# Patient Record
Sex: Female | Born: 1960 | Race: White | Hispanic: No | Marital: Married | Smoking: Former smoker
Health system: Southern US, Community
[De-identification: ages and names within clinical notes are randomized; demographics above are authoritative.]

## PROBLEM LIST (undated history)

## (undated) DIAGNOSIS — N952 Postmenopausal atrophic vaginitis: Secondary | ICD-10-CM

## (undated) DIAGNOSIS — K635 Polyp of colon: Secondary | ICD-10-CM

## (undated) DIAGNOSIS — F32A Depression, unspecified: Secondary | ICD-10-CM

## (undated) DIAGNOSIS — G4700 Insomnia, unspecified: Secondary | ICD-10-CM

## (undated) DIAGNOSIS — F419 Anxiety disorder, unspecified: Secondary | ICD-10-CM

## (undated) DIAGNOSIS — H16139 Photokeratitis, unspecified eye: Secondary | ICD-10-CM

## (undated) DIAGNOSIS — E785 Hyperlipidemia, unspecified: Secondary | ICD-10-CM

## (undated) DIAGNOSIS — N809 Endometriosis, unspecified: Secondary | ICD-10-CM

## (undated) DIAGNOSIS — A6000 Herpesviral infection of urogenital system, unspecified: Secondary | ICD-10-CM

## (undated) DIAGNOSIS — M549 Dorsalgia, unspecified: Secondary | ICD-10-CM

## (undated) DIAGNOSIS — F329 Major depressive disorder, single episode, unspecified: Secondary | ICD-10-CM

## (undated) DIAGNOSIS — K5900 Constipation, unspecified: Secondary | ICD-10-CM

## (undated) DIAGNOSIS — N83209 Unspecified ovarian cyst, unspecified side: Secondary | ICD-10-CM

## (undated) HISTORY — DX: Polyp of colon: K63.5

## (undated) HISTORY — PX: ANKLE FRACTURE SURGERY: SHX122

## (undated) HISTORY — DX: Endometriosis, unspecified: N80.9

## (undated) HISTORY — PX: COLONOSCOPY: SHX174

## (undated) HISTORY — DX: Constipation, unspecified: K59.00

## (undated) HISTORY — DX: Depression, unspecified: F32.A

## (undated) HISTORY — DX: Anxiety disorder, unspecified: F41.9

## (undated) HISTORY — DX: Postmenopausal atrophic vaginitis: N95.2

## (undated) HISTORY — DX: Hyperlipidemia, unspecified: E78.5

## (undated) HISTORY — DX: Unspecified ovarian cyst, unspecified side: N83.209

## (undated) HISTORY — PX: OTHER SURGICAL HISTORY: SHX169

## (undated) HISTORY — PX: APPENDECTOMY: SHX54

## (undated) HISTORY — DX: Major depressive disorder, single episode, unspecified: F32.9

## (undated) HISTORY — DX: Photokeratitis, unspecified eye: H16.139

## (undated) HISTORY — DX: Dorsalgia, unspecified: M54.9

## (undated) HISTORY — PX: BREAST IMPLANT EXCHANGE: SHX6296

## (undated) HISTORY — PX: TUBAL LIGATION: SHX77

## (undated) HISTORY — DX: Herpesviral infection of urogenital system, unspecified: A60.00

## (undated) HISTORY — DX: Insomnia, unspecified: G47.00

## (undated) HISTORY — PX: EYE SURGERY: SHX253

---
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
## 1998-07-14 ENCOUNTER — Ambulatory Visit (HOSPITAL_COMMUNITY): Admission: RE | Admit: 1998-07-14 | Discharge: 1998-07-14 | Payer: Self-pay | Admitting: Obstetrics and Gynecology

## 1999-06-07 ENCOUNTER — Other Ambulatory Visit: Admission: RE | Admit: 1999-06-07 | Discharge: 1999-06-07 | Payer: Self-pay | Admitting: Obstetrics and Gynecology

## 2000-06-15 ENCOUNTER — Other Ambulatory Visit: Admission: RE | Admit: 2000-06-15 | Discharge: 2000-06-15 | Payer: Self-pay | Admitting: Obstetrics and Gynecology

## 2000-09-16 ENCOUNTER — Other Ambulatory Visit: Admission: RE | Admit: 2000-09-16 | Discharge: 2000-09-16 | Payer: Self-pay | Admitting: Gastroenterology

## 2001-07-12 ENCOUNTER — Other Ambulatory Visit: Admission: RE | Admit: 2001-07-12 | Discharge: 2001-07-12 | Payer: Self-pay | Admitting: Obstetrics and Gynecology

## 2002-06-22 ENCOUNTER — Other Ambulatory Visit: Admission: RE | Admit: 2002-06-22 | Discharge: 2002-06-22 | Payer: Self-pay | Admitting: Obstetrics and Gynecology

## 2002-11-15 ENCOUNTER — Encounter: Payer: Self-pay | Admitting: Emergency Medicine

## 2002-11-15 ENCOUNTER — Observation Stay (HOSPITAL_COMMUNITY): Admission: EM | Admit: 2002-11-15 | Discharge: 2002-11-16 | Payer: Self-pay | Admitting: Emergency Medicine

## 2003-07-27 ENCOUNTER — Other Ambulatory Visit: Admission: RE | Admit: 2003-07-27 | Discharge: 2003-07-27 | Payer: Self-pay | Admitting: Obstetrics and Gynecology

## 2004-08-14 ENCOUNTER — Other Ambulatory Visit: Admission: RE | Admit: 2004-08-14 | Discharge: 2004-08-14 | Payer: Self-pay | Admitting: Obstetrics and Gynecology

## 2004-10-08 ENCOUNTER — Ambulatory Visit: Payer: Self-pay | Admitting: Gastroenterology

## 2004-10-21 ENCOUNTER — Ambulatory Visit: Payer: Self-pay | Admitting: Gastroenterology

## 2004-12-23 ENCOUNTER — Other Ambulatory Visit: Admission: RE | Admit: 2004-12-23 | Discharge: 2004-12-23 | Payer: Self-pay | Admitting: Obstetrics and Gynecology

## 2005-03-24 ENCOUNTER — Ambulatory Visit: Payer: Self-pay | Admitting: Internal Medicine

## 2005-04-21 ENCOUNTER — Ambulatory Visit: Payer: Self-pay | Admitting: Family Medicine

## 2005-09-23 ENCOUNTER — Other Ambulatory Visit: Admission: RE | Admit: 2005-09-23 | Discharge: 2005-09-23 | Payer: Self-pay | Admitting: Obstetrics and Gynecology

## 2005-12-31 ENCOUNTER — Ambulatory Visit: Payer: Self-pay | Admitting: Internal Medicine

## 2006-08-04 ENCOUNTER — Ambulatory Visit: Payer: Self-pay | Admitting: Internal Medicine

## 2007-01-08 LAB — CONVERTED CEMR LAB: Pap Smear: NORMAL

## 2007-04-05 ENCOUNTER — Ambulatory Visit: Payer: Self-pay | Admitting: Internal Medicine

## 2007-04-05 LAB — CONVERTED CEMR LAB
ALT: 9 units/L (ref 0–40)
AST: 16 units/L (ref 0–37)
Cholesterol: 174 mg/dL (ref 0–200)
HDL: 61 mg/dL (ref >39.0)
LDL Cholesterol: 96 mg/dL (ref 0–99)
TSH: 1.56 microintl units/mL (ref 0.35–5.50)
Total CHOL/HDL Ratio: 2.9
Triglycerides: 87 mg/dL (ref 0–149)
VLDL: 17 mg/dL (ref 0–40)

## 2007-08-03 ENCOUNTER — Telehealth: Payer: Self-pay | Admitting: Internal Medicine

## 2007-11-25 ENCOUNTER — Telehealth: Payer: Self-pay | Admitting: Internal Medicine

## 2008-01-20 ENCOUNTER — Ambulatory Visit: Payer: Self-pay | Admitting: Internal Medicine

## 2008-01-20 DIAGNOSIS — E785 Hyperlipidemia, unspecified: Secondary | ICD-10-CM | POA: Insufficient documentation

## 2008-01-20 DIAGNOSIS — N809 Endometriosis, unspecified: Secondary | ICD-10-CM | POA: Insufficient documentation

## 2008-01-20 DIAGNOSIS — F32A Depression, unspecified: Secondary | ICD-10-CM | POA: Insufficient documentation

## 2008-01-20 DIAGNOSIS — F329 Major depressive disorder, single episode, unspecified: Secondary | ICD-10-CM

## 2008-01-20 DIAGNOSIS — F419 Anxiety disorder, unspecified: Secondary | ICD-10-CM

## 2008-01-27 LAB — CONVERTED CEMR LAB
ALT: 10 units/L (ref 0–35)
AST: 16 units/L (ref 0–37)
BUN: 13 mg/dL (ref 6–23)
Basophils Absolute: 0 10*3/uL (ref 0.0–0.1)
Basophils Relative: 0.1 % (ref 0.0–1.0)
CO2: 30 meq/L (ref 19–32)
Calcium: 9.6 mg/dL (ref 8.4–10.5)
Chloride: 104 meq/L (ref 96–112)
Cholesterol: 164 mg/dL (ref 0–200)
Creatinine, Ser: 0.9 mg/dL (ref 0.4–1.2)
Eosinophils Absolute: 0.1 10*3/uL (ref 0.0–0.6)
Eosinophils Relative: 1 % (ref 0.0–5.0)
GFR calc Af Amer: 87 mL/min
GFR calc non Af Amer: 72 mL/min
Glucose, Bld: 84 mg/dL (ref 70–99)
HCT: 37.5 % (ref 36.0–46.0)
HDL: 64.4 mg/dL (ref >39.0)
Hemoglobin: 12.5 g/dL (ref 12.0–15.0)
LDL Cholesterol: 88 mg/dL (ref 0–99)
Lymphocytes Relative: 31.5 % (ref 12.0–46.0)
MCHC: 33.2 g/dL (ref 30.0–36.0)
MCV: 93.7 fL (ref 78.0–100.0)
Monocytes Absolute: 0.3 10*3/uL (ref 0.2–0.7)
Monocytes Relative: 5.5 % (ref 3.0–11.0)
Neutro Abs: 3.3 10*3/uL (ref 1.4–7.7)
Neutrophils Relative %: 61.9 % (ref 43.0–77.0)
Platelets: 231 10*3/uL (ref 150–400)
Potassium: 3.8 meq/L (ref 3.5–5.1)
RBC: 4.01 M/uL (ref 3.87–5.11)
RDW: 12.3 % (ref 11.5–14.6)
Sodium: 141 meq/L (ref 135–145)
TSH: 1.15 microintl units/mL (ref 0.35–5.50)
Total CHOL/HDL Ratio: 2.5
Triglycerides: 57 mg/dL (ref 0–149)
VLDL: 11 mg/dL (ref 0–40)
WBC: 5.4 10*3/uL (ref 4.5–10.5)

## 2008-03-15 ENCOUNTER — Telehealth: Payer: Self-pay | Admitting: Internal Medicine

## 2008-08-07 ENCOUNTER — Telehealth (INDEPENDENT_AMBULATORY_CARE_PROVIDER_SITE_OTHER): Payer: Self-pay | Admitting: *Deleted

## 2008-08-25 ENCOUNTER — Ambulatory Visit: Payer: Self-pay | Admitting: Internal Medicine

## 2008-11-27 ENCOUNTER — Telehealth (INDEPENDENT_AMBULATORY_CARE_PROVIDER_SITE_OTHER): Payer: Self-pay | Admitting: *Deleted

## 2009-01-11 ENCOUNTER — Telehealth (INDEPENDENT_AMBULATORY_CARE_PROVIDER_SITE_OTHER): Payer: Self-pay | Admitting: *Deleted

## 2009-02-26 ENCOUNTER — Ambulatory Visit: Payer: Self-pay | Admitting: Internal Medicine

## 2009-02-28 LAB — CONVERTED CEMR LAB
BUN: 9 mg/dL (ref 6–23)
Basophils Absolute: 0 10*3/uL (ref 0.0–0.1)
Basophils Relative: 0.3 % (ref 0.0–3.0)
CO2: 31 meq/L (ref 19–32)
Calcium: 9.4 mg/dL (ref 8.4–10.5)
Creatinine, Ser: 0.7 mg/dL (ref 0.4–1.2)
Eosinophils Absolute: 0.1 10*3/uL (ref 0.0–0.7)
Glucose, Bld: 84 mg/dL (ref 70–99)
HCT: 35.7 % — ABNORMAL LOW (ref 36.0–46.0)
HDL: 62 mg/dL (ref >39.00)
Hemoglobin: 12.5 g/dL (ref 12.0–15.0)
LDL Cholesterol: 99 mg/dL (ref 0–99)
Lymphs Abs: 1.7 10*3/uL (ref 0.7–4.0)
MCHC: 35 g/dL (ref 30.0–36.0)
Neutro Abs: 3.1 10*3/uL (ref 1.4–7.7)
RBC: 3.87 M/uL (ref 3.87–5.11)
RDW: 12 % (ref 11.5–14.6)
Total CHOL/HDL Ratio: 3

## 2009-04-16 ENCOUNTER — Ambulatory Visit: Payer: Self-pay | Admitting: Gastroenterology

## 2009-08-08 ENCOUNTER — Telehealth (INDEPENDENT_AMBULATORY_CARE_PROVIDER_SITE_OTHER): Payer: Self-pay | Admitting: *Deleted

## 2009-08-15 ENCOUNTER — Ambulatory Visit: Payer: Self-pay | Admitting: Internal Medicine

## 2009-08-21 ENCOUNTER — Telehealth (INDEPENDENT_AMBULATORY_CARE_PROVIDER_SITE_OTHER): Payer: Self-pay | Admitting: *Deleted

## 2009-08-22 LAB — CONVERTED CEMR LAB
Basophils Absolute: 0 10*3/uL (ref 0.0–0.1)
Eosinophils Relative: 0.5 % (ref 0.0–5.0)
HCT: 37.6 % (ref 36.0–46.0)
Lymphocytes Relative: 20.6 % (ref 12.0–46.0)
Lymphs Abs: 1.9 10*3/uL (ref 0.7–4.0)
Monocytes Relative: 5.7 % (ref 3.0–12.0)
Platelets: 207 10*3/uL (ref 150.0–400.0)
RDW: 12.7 % (ref 11.5–14.6)
WBC: 9.4 10*3/uL (ref 4.5–10.5)

## 2009-09-17 ENCOUNTER — Telehealth: Payer: Self-pay | Admitting: Gastroenterology

## 2009-09-26 ENCOUNTER — Telehealth: Payer: Self-pay | Admitting: Gastroenterology

## 2009-09-26 ENCOUNTER — Encounter (INDEPENDENT_AMBULATORY_CARE_PROVIDER_SITE_OTHER): Payer: Self-pay | Admitting: *Deleted

## 2009-10-05 ENCOUNTER — Ambulatory Visit: Payer: Self-pay | Admitting: Gastroenterology

## 2010-01-08 LAB — CONVERTED CEMR LAB: Pap Smear: NORMAL

## 2010-02-25 ENCOUNTER — Telehealth (INDEPENDENT_AMBULATORY_CARE_PROVIDER_SITE_OTHER): Payer: Self-pay | Admitting: *Deleted

## 2010-02-25 ENCOUNTER — Ambulatory Visit: Payer: Self-pay | Admitting: Diagnostic Radiology

## 2010-03-20 ENCOUNTER — Ambulatory Visit: Payer: Self-pay | Admitting: Internal Medicine

## 2010-04-04 DIAGNOSIS — F17200 Nicotine dependence, unspecified, uncomplicated: Secondary | ICD-10-CM | POA: Insufficient documentation

## 2010-04-09 ENCOUNTER — Ambulatory Visit: Payer: Self-pay | Admitting: Family Medicine

## 2010-04-09 DIAGNOSIS — N760 Acute vaginitis: Secondary | ICD-10-CM | POA: Insufficient documentation

## 2010-04-09 DIAGNOSIS — R5381 Other malaise: Secondary | ICD-10-CM | POA: Insufficient documentation

## 2010-04-09 DIAGNOSIS — R1084 Generalized abdominal pain: Secondary | ICD-10-CM | POA: Insufficient documentation

## 2010-04-09 DIAGNOSIS — R350 Frequency of micturition: Secondary | ICD-10-CM | POA: Insufficient documentation

## 2010-04-09 DIAGNOSIS — R5383 Other fatigue: Secondary | ICD-10-CM

## 2010-04-09 LAB — CONVERTED CEMR LAB
ALT: 8 units/L (ref 0–35)
AST: 22 units/L (ref 0–37)
BUN: 8 mg/dL (ref 6–23)
Basophils Relative: 0.5 % (ref 0.0–3.0)
Bilirubin Urine: NEGATIVE
Blood in Urine, dipstick: NEGATIVE
Chloride: 102 meq/L (ref 96–112)
Eosinophils Relative: 0.7 % (ref 0.0–5.0)
GFR calc non Af Amer: 95.4 mL/min (ref >60)
Glucose, Urine, Semiquant: NEGATIVE
HCT: 34.4 % — ABNORMAL LOW (ref 36.0–46.0)
Hemoglobin: 11.8 g/dL — ABNORMAL LOW (ref 12.0–15.0)
KOH Prep: NEGATIVE
Ketones, urine, test strip: NEGATIVE
Lymphs Abs: 1.7 10*3/uL (ref 0.7–4.0)
MCV: 93.2 fL (ref 78.0–100.0)
Monocytes Absolute: 0.4 10*3/uL (ref 0.1–1.0)
Monocytes Relative: 5.4 % (ref 3.0–12.0)
Nitrite: NEGATIVE
Platelets: 197 10*3/uL (ref 150.0–400.0)
Potassium: 4.1 meq/L (ref 3.5–5.1)
RBC: 3.69 M/uL — ABNORMAL LOW (ref 3.87–5.11)
Sodium: 137 meq/L (ref 135–145)
TSH: 1.98 microintl units/mL (ref 0.35–5.50)
Total Bilirubin: 0.4 mg/dL (ref 0.3–1.2)
Total Protein: 6 g/dL (ref 6.0–8.3)
WBC: 6.5 10*3/uL (ref 4.5–10.5)
pH: 6.5

## 2010-04-10 ENCOUNTER — Telehealth (INDEPENDENT_AMBULATORY_CARE_PROVIDER_SITE_OTHER): Payer: Self-pay | Admitting: *Deleted

## 2010-04-12 ENCOUNTER — Telehealth: Payer: Self-pay | Admitting: Family Medicine

## 2010-04-19 ENCOUNTER — Ambulatory Visit: Payer: Self-pay | Admitting: Internal Medicine

## 2010-04-23 ENCOUNTER — Ambulatory Visit: Payer: Self-pay | Admitting: Internal Medicine

## 2010-04-23 DIAGNOSIS — R319 Hematuria, unspecified: Secondary | ICD-10-CM | POA: Insufficient documentation

## 2010-04-23 LAB — CONVERTED CEMR LAB
Ferritin: 45.5 ng/mL (ref 10.0–291.0)
HDL: 66 mg/dL (ref >39.00)
Iron: 64 ug/dL (ref 42–145)
Nitrite: NEGATIVE
Specific Gravity, Urine: 1.01
Total CHOL/HDL Ratio: 3
Urobilinogen, UA: 0.2
VLDL: 10.4 mg/dL (ref 0.0–40.0)
pH: 6

## 2010-04-24 ENCOUNTER — Encounter: Payer: Self-pay | Admitting: Internal Medicine

## 2010-04-26 ENCOUNTER — Telehealth (INDEPENDENT_AMBULATORY_CARE_PROVIDER_SITE_OTHER): Payer: Self-pay | Admitting: *Deleted

## 2010-05-09 ENCOUNTER — Telehealth (INDEPENDENT_AMBULATORY_CARE_PROVIDER_SITE_OTHER): Payer: Self-pay | Admitting: *Deleted

## 2010-05-28 ENCOUNTER — Telehealth (INDEPENDENT_AMBULATORY_CARE_PROVIDER_SITE_OTHER): Payer: Self-pay | Admitting: *Deleted

## 2010-09-06 ENCOUNTER — Telehealth: Payer: Self-pay | Admitting: Internal Medicine

## 2010-11-14 ENCOUNTER — Emergency Department (HOSPITAL_BASED_OUTPATIENT_CLINIC_OR_DEPARTMENT_OTHER): Admission: EM | Admit: 2010-11-14 | Discharge: 2010-02-25 | Payer: Self-pay | Admitting: Emergency Medicine

## 2011-01-05 LAB — CONVERTED CEMR LAB
Basophils Relative: 0.7 % (ref 0.0–3.0)
Eosinophils Absolute: 0.1 10*3/uL (ref 0.0–0.7)
Eosinophils Relative: 1 % (ref 0.0–5.0)
HCT: 36.8 % (ref 36.0–46.0)
Hemoglobin: 12.7 g/dL (ref 12.0–15.0)
MCV: 93.3 fL (ref 78.0–100.0)
Monocytes Absolute: 0.3 10*3/uL (ref 0.1–1.0)
Neutro Abs: 3.2 10*3/uL (ref 1.4–7.7)
Platelets: 233 10*3/uL (ref 150–400)
RBC: 3.94 M/uL (ref 3.87–5.11)
WBC: 5.2 10*3/uL (ref 4.5–10.5)

## 2011-01-09 NOTE — Progress Notes (Signed)
Summary: FYI RE; YEAST  Phone Note Call from Patient Call back at Work Phone [phone]   Caller: Patient Summary of Call: patient finished med for yeast infection [id number] - she feels yest infection is coming bac -  Initial call taken by: Okey Regal Spring,  Apr 12, 2010 11:17 AM  Follow-up for Phone Call        PT SEEN 04/09/10 FOR YEAST WAS GIVEN DIFLUCAN #2. PER DR Burlon Centrella OFFICE NOTE CAN TAKE 2ND PILL IN 3 DAYS IF SX PERSIST.  INFORMED PT TO TAKE 2ND PILL --PT AGREED WILL CALL NEXT WEEK IF NO BETTER .Kandice Hams  Apr 12, 2010 12:01 PM  Follow-up by: Kandice Hams,  Apr 12, 2010 12:01 PM  Additional Follow-up for Phone Call Additional follow up Details #1::        pt has f/u w/ Dr Drue Novel next week (if she followed my instructions) Additional Follow-up by: Neena Rhymes MD,  Apr 12, 2010 12:05 PM

## 2011-01-09 NOTE — Progress Notes (Signed)
Summary: results  Phone Note Outgoing Call Call back at Home Phone [phone]   Reason for Call: Discuss lab or test results Details for Reason: advice patient, and urine culture negative, fungal culture pending  Signed by [facility] E. Paz MD on 04/26/2010 at 11:48 AM Summary of Call: left message on machine for pt to return call........Marland KitchenShary Decamp  Apr 26, 2010 4:27 PM left message with family member to have pt return call, left message on cell [phone]........Marland KitchenShary Decamp  May 01, 2010 4:17 PM DISCUSSED WITH PT....Marland KitchenMarland KitchenShary Decamp  May 01, 2010 4:23 PM

## 2011-01-09 NOTE — Progress Notes (Signed)
Summary: labs  Phone Note Outgoing Call   Call placed by: Doristine Devoid,  Apr 10, 2010 1:49 PM Summary of Call: labs normal w/ exception of mild anemia- no obvious cause for pt's multitude of sxs.   Follow-up for Phone Call        left message on machine ........Marland KitchenDoristine Devoid  Apr 10, 2010 1:50 PM   spoke w/ patient aware of labs ...Marland KitchenMarland KitchenDoristine Devoid  Apr 11, 2010 10:22 AM

## 2011-01-09 NOTE — Progress Notes (Signed)
Summary: lab-results  Phone Note Outgoing Call   Call placed by: Jeremy Johann CMA,  May 28, 2010 4:25 PM Details for Reason: advise  patient, fungal culture from 4 weeks ago negative Summary of Call: left message to call  office................Marland KitchenFelecia Deloach CMA  May 28, 2010 4:25 PM   Follow-up for Phone Call        DISCUSS WITH PATIENT ....................Marland KitchenFelecia Deloach CMA  May 29, 2010 10:58 AM  letter mailed

## 2011-01-09 NOTE — Progress Notes (Signed)
Summary: Refill Request  Phone Note Refill Request Message from:  [facility] Fax #: [phone]  Refills Requested: Medication #1:  LORAZEPAM 0.5 MG  TABS 1 by mouth at bedtime prn   Dosage confirmed as above?Dosage Confirmed   Supply Requested: 3 months   Last Refilled: 01/26/2010   Notes: 2 refills Next Appointment Scheduled: none Initial call taken by: Harold Barban,  February 25, 2010 9:30 AM  Follow-up for Phone Call        was rx'd #90 with 2 refills 08/22/09, DENIED - pt should still have one refill left on rx Pharmacy advised Shary Decamp  February 25, 2010 9:36 AM      Appended Document: Refill Request Pharmacy returned call -- rx expired. ok x 30 only due cpx Shary Decamp  February 25, 2010 2:18 PM      Clinical Lists Changes  Medications: Rx of LORAZEPAM 0.5 MG  TABS (LORAZEPAM) 1 by mouth at bedtime prn;  #30 x 0;  Signed;  Entered by: Shary Decamp;  Authorized by: Nolon Rod Paz MD;  Method used: Printed then faxed to [facility] [phone]*, [address] [place], [place], [place]  [address], Ph: [phone], Fax: [phone]    Prescriptions: LORAZEPAM 0.5 MG  TABS (LORAZEPAM) 1 by mouth at bedtime prn  #30 x 0   Entered by:   Shary Decamp   Authorized by:   Nolon Rod. Paz MD   Signed by:   Shary Decamp on 02/25/2010   Method used:   Printed then faxed to ...       [facility] [phone]* (retail)       [address]       [place], [place]  [address]       Ph: [phone]       Fax: [phone]   RxID:   [id number]

## 2011-01-09 NOTE — Assessment & Plan Note (Signed)
Summary: yeast infection/cbs   Vital Signs:  Patient profile:   50 year old female Height:      64.5 inches Weight:      156.2 pounds Pulse rate:   70 / minute BP sitting:   140 / 70  Vitals Entered By: Shary Decamp (Apr 23, 2010 8:12 AM) CC: Adrienne Burke was treated for yeast 5/3 -- now with white vag d/c x yesterday, +odor, Adrienne Burke has taken 2 diflucan & used monistat   History of Present Illness: the patient  is s/p  a two-week treatment with Diflucan  as prescribed by the [facility] for yeast in her mouth and  "generalized yeast"; unclear how the diagnosis was made  Then she saw Dr. Beverely Low with a number of symptoms, she was prescribed an additional dose of Diflucan and was  recommended MiraLax for constipation. The patient felt slightly better, she also used Monistat over the counter. patient is now here today stating that "symptoms started to come back as soon as I finished Diflucan": described her symptoms as  more bloated, gassy, increased constipation from baseline Patient believes she has a diffuse yeast infection as the cause of her symptoms She also believes she has yeast in her tonge (oral cavity is  normal to exam today)  Review of systems Specifically denies any vaginal rash minimal  vaginal discharge for the last day or 2, clear in color Very mild urinary frequency-dysuria (if any) Denies intercourse pain, no fever  Current Medications (verified): 1)  Lipitor 20 Mg Tabs (Atorvastatin Calcium) .Marland Kitchen.. 1 Tablet By Mouth Once Daily 2)  Sertraline Hcl 100 Mg Tabs (Sertraline Hcl) .Marland Kitchen.. 1 By Mouth 3)  Lorazepam 0.5 Mg  Tabs (Lorazepam) .Marland Kitchen.. 1 By Mouth At Bedtime Prn 4)  Multivitamins  Tabs (Multiple Vitamin) .Marland Kitchen.. 1 Tablet By Mouth Once Daily 5)  Calcium 500 Mg Tabs (Calcium Carbonate) .Marland Kitchen.. 1 Tablet By Mouth Once Daily 6)  Vit D3 7)  Mvi  Allergies (verified): 1)  * Penicillins Group 2)  Codeine Phosphate (Codeine Phosphate) 3)  * Sulfa (Sulfonamides) Group  Past  History:  Past Medical History: Reviewed history from 03/20/2010 and no changes required. G3 P2 ab 1 Hyperlipidemia Endometriosis. s/p exploration  DUB perimenopausal, Dx 3/11 Anxiety--Depression Hemorrhoids Hyperplastic polyps 10/2004, Normal Cscope 2010, next 2010  Past Surgical History: Reviewed history from 03/20/2010 and no changes required. Tubal ligation Appendectomy breast lift-implant 2009,  repeated  08-2009 Ankle surgery for repair of fracture Uterine ablation  Review of Systems      See HPI  Physical Exam  General:  alert, well-developed, and well-nourished.   Mouth:  Tongue  without lesions, discharge;  normal in color Abdomen:  soft, no distention, no masses, no guarding, and no rigidity.  ? Slightly tender at the left lower abdomen, no mass Genitalia:  normal introitus and no external lesions.  very mild redness between the labia, no discharge. Vagina is normal, no discharge, cervix is normal to inspection   Impression & Recommendations:  Problem # 1:  VAGINITIS (ICD-616.10) question of vaginitis along with a number of other symptoms: Patient feeling bloated, gassy, more constipated than usual. Recent labs include a negative HIV,   mildly  low hemoglobin without  iron deficiency, TSH normal, normal sugar. I couldn't perform an  adequate quality wet prep today. A vaginal culture for yeast was sent Based on the female examination, I doubt she has a yeast infection, in fact most of her symptoms seem unrelated to a vaginitis. I will  recommend Metamucil, schedule MiraLax to go with the constipation and bloating. if no better, will refer to GI I also do recommend her to see her gynecologist. I'll call Dr Henderson Cloud and set that up this week patient's cell [phone] a urine culture was also sent today  Orders: T- * Misc. Laboratory test [phone]) Gynecologic Referral (Gyn)  Problem # 2:  LUNG CANCER, FAMILY HX (ICD-V16.1) in reference to lung cancer screening, this  was discussed (informally) with pulmonary: --current ATS guidelines do  not support CTs for lung cancer screening --an approach to  this could  be periodic chest x-rays --If the Adrienne Burke feels strongly, there may be some support in the literature for CT scan.  chest x-ray done recently negative Plan:  chest x-rays in the future  Problem # 3:  mild anemia discussed with patient, no iron  deficiency,will  check Hg  periodically  Problem # 4:  CONSTIPATION (ICD-564.00) see #1  Problem # 5:  time spent you to 35 minutes d/t  chart reviewe, patient counseling, patient  care  coordination, discussing the patient's symptoms ( she is convinced she has a generalized yeast infection, I found no objective evidence of such)  Complete Medication List: 1)  Lipitor 20 Mg Tabs (Atorvastatin calcium) .Marland Kitchen.. 1 tablet by mouth once daily 2)  Sertraline Hcl 100 Mg Tabs (Sertraline hcl) .Marland Kitchen.. 1 by mouth 3)  Lorazepam 0.5 Mg Tabs (Lorazepam) .Marland Kitchen.. 1 by mouth at bedtime prn 4)  Multivitamins Tabs (Multiple vitamin) .Marland Kitchen.. 1 tablet by mouth once daily 5)  Calcium 500 Mg Tabs (Calcium carbonate) .Marland Kitchen.. 1 tablet by mouth once daily 6)  Vit D3  7)  Mvi   Other Orders: Specimen Handling (81191) T-Urine Microscopic ([id number]) T-Culture, Urine ([id number]) UA Dipstick w/o Micro (manual) (29528)  Patient Instructions: 1)  Metamucil capsule one tablet t.i.d. with meals and abundant fluids 2)  MiraLax 17 g with fluids daily 3)  Please see your gynecologist  Laboratory Results   Urine Tests    Routine Urinalysis   Glucose: negative   (Normal Range: Negative) Bilirubin: negative   (Normal Range: Negative) Ketone: negative   (Normal Range: Negative) Spec. Gravity: 1.010   (Normal Range: 1.003-1.035) Blood: trace   (Normal Range: Negative) pH: 6.0   (Normal Range: 5.0-8.0) Protein: negative   (Normal Range: Negative) Urobilinogen: 0.2   (Normal Range: 0-1) Nitrite: negative   (Normal Range: Negative) Leukocyte  Esterace: negative   (Normal Range: Negative)

## 2011-01-09 NOTE — Assessment & Plan Note (Signed)
Summary: itching,frequent urination/bloating/cbs   Vital Signs:  Patient profile:   50 year old female Weight:      155.8 pounds Temp:     97.2 degrees F oral BP sitting:   104 / 60  (left arm)  Vitals Entered By: Doristine Devoid (Apr 09, 2010 10:37 AM) CC: vaginal burning and itching some frequent urination and back pain    History of Present Illness: 50 yo woman here today for vaginal burning and itching.  has been going to [facility] and was told she had oral yeast.  took diflucan 200mg  every other days x14 days.  using probiotics for constipation.  now w/ vaginal sxs.    also c/o gas/bloating/constipation.  no BM since Sunday AM.  lots of gas.  reports abd is painful.  pt reports eating lots of fruits/vegetables.  drinking 4-5 glasses of water daily.  drinks a lot of caffeine.  has not tried OTC stool softeners, laxatives.  also c/o of fatigue.  concerned about her thyroid.  Allergies (verified): 1)  * Penicillins Group 2)  Codeine Phosphate (Codeine Phosphate) 3)  * Sulfa (Sulfonamides) Group  Past History:  Past Medical History: Last updated: 03/20/2010 G3 P2 ab 1 Hyperlipidemia Endometriosis. s/p exploration  DUB perimenopausal, Dx 3/11 Anxiety--Depression Hemorrhoids Hyperplastic polyps 10/2004, Normal Cscope 2010, next 2010  Family History: Last updated: 03/20/2010 HTN - MGF, MGM CAD - MGF (first MI in his late 32s, 17s) lung Ca - M, F DM - M colon Ca - PGF breast Ca - no liver Ca - PGF  M - deceased, lung Ca, emphysema, COPD +smoker F - deceased, lung Ca +smoker  Review of Systems General:  Complains of fatigue, loss of appetite, malaise, and weakness. ENT:  Denies sore throat. CV:  Denies chest pain or discomfort, lightheadness, palpitations, and shortness of breath with exertion. GI:  Complains of abdominal pain, change in bowel habits, and constipation. GU:  Denies discharge. MS:  Complains of muscle aches.  Physical Exam  General:   alert, well-developed, and well-nourished.  anxious Head:  Normocephalic and atraumatic without obvious abnormalities. No apparent alopecia or balding. Mouth:  no oral yeast seen, tongue normal in color and appearance.  no plaques on cheeks or palate Neck:  no masses, no thyromegaly Lungs:  normal respiratory effort, no intercostal retractions, no accessory muscle use, and normal breath sounds.   Heart:  normal rate, regular rhythm, no murmur, and no gallop.   Abdomen:  soft, non-tender, no distention, no masses, no guarding, +BS Genitalia:  normal introitus, no external lesions, no vaginal discharge, mucosa pink and moist, and no vaginal or cervical lesions.   Psych:  anxious, convinced of 'something's wrong'   Impression & Recommendations:  Problem # 1:  VAGINITIS (ICD-616.10) Assessment New  pt with few scattered yeast on wet prep.  not the overwhelming yeast pt was expecting as she feels she has yeast 'overrunning' her body.  no oral yeast appreciated which would require additional extensive tx.  pt has completed 2 week course of diflucan.  will prescribe normal vaginitis dose.  pt does not feel this is sufficient, but there is no evidence to prescribe more than this.  Orders: Wet Prep ([id number])  Problem # 2:  FREQUENCY, URINARY (ICD-788.41) Assessment: New pt's UA WNL.  no evidence of infxn. Orders: UA Dipstick w/o Micro (manual) ([id number])  Problem # 3:  ABDOMINAL PAIN, GENERALIZED (ICD-789.07) Assessment: New no abnormalities on PE.  will check labs to r/o infxn, electrolyte  disturbance, liver dysfxn.  at this time it seems likely pt is having gas pains due to irregular BMs.  encouraged her to hold her probiotics since sxs started after she began taking these.  also should try stool softeners, possibly miralax w/ goal of 1 BM daily.  she should have close f/u w/ PCP or return sooner if sxs worsen. Orders: Venipuncture (16109) TLB-BMP (Basic Metabolic Panel-BMET)  ([id number]-METABOL) TLB-CBC Platelet - w/Differential (85025-CBCD) TLB-Hepatic/Liver Function Pnl (80076-HEPATIC)  Problem # 4:  FATIGUE (ICD-780.79) Assessment: New pt's multitude of complaints included non-specific fatigue.  will check HIV (given her report of oral yeast) and TSH.  encouraged regular exercise, healthy eating, and plenty of rest. Orders: T-HIV Antibody  (Reflex) ([id number]) TLB-TSH (Thyroid Stimulating Hormone) (84443-TSH)  Problem # 5:  CONSTIPATION (ICD-564.00) Assessment: Unchanged likely cause of pt's gas and bloating.  see problem 3.  OTC stool softeners and Miralax.  Complete Medication List: 1)  Lipitor 20 Mg Tabs (Atorvastatin calcium) .Marland Kitchen.. 1 tablet by mouth once daily 2)  Sertraline Hcl 100 Mg Tabs (Sertraline hcl) .Marland Kitchen.. 1 by mouth 3)  Lorazepam 0.5 Mg Tabs (Lorazepam) .Marland Kitchen.. 1 by mouth at bedtime prn 4)  Multivitamins Tabs (Multiple vitamin) .Marland Kitchen.. 1 tablet by mouth once daily 5)  Calcium 500 Mg Tabs (Calcium carbonate) .Marland Kitchen.. 1 tablet by mouth once daily 6)  Vit D3  7)  Mvi  8)  Diflucan 150 Mg Tabs (Fluconazole) .... Once daily x1.  may repeat dose in 3 days if sxs persist  Patient Instructions: 1)  Please schedule a follow-up appointment in 1 week with Dr Drue Novel. 2)  We'll notify you of your lab results 3)  Take the Diflucan as directed 4)  STOP the probiotics as your symptoms all started after taking these 5)  Drink plenty of fluids 6)  Eat a high fiber diet 7)  Start an OTC stool softener every day 8)  Use Miralax every 2-3 days to produce a bowel movement 9)  Hang in there!  Prescriptions: DIFLUCAN 150 MG TABS (FLUCONAZOLE) once daily x1.  may repeat dose in 3 days if sxs persist  #2 x 0   Entered and Authorized by:   Neena Rhymes MD   Signed by:   Neena Rhymes MD on 04/09/2010   Method used:   [facility] to        [facility] [phone]* (retail)       [address]       [place], [place]  [address]       Ph:  [phone]       Fax: [phone]   RxID:   [id number]   Laboratory Results   Urine Tests    Routine Urinalysis   Glucose: negative   (Normal Range: Negative) Bilirubin: negative   (Normal Range: Negative) Ketone: negative   (Normal Range: Negative) Spec. Gravity: 1.015   (Normal Range: 1.003-1.035) Blood: negative   (Normal Range: Negative) pH: 6.5   (Normal Range: 5.0-8.0) Protein: negative   (Normal Range: Negative) Urobilinogen: 0.2   (Normal Range: 0-1) Nitrite: negative   (Normal Range: Negative) Leukocyte Esterace: negative   (Normal Range: Negative)      Wet Mount Bacteria/hpf: 1+  Rods Clue cells/hpf: none Yeast/hpf: few Wet Mount KOH: Negative Trichomonas/hpf: none   Laboratory Results   Urine Tests    Routine Urinalysis   Glucose: negative   (Normal Range: Negative) Bilirubin: negative   (Normal Range: Negative) Ketone: negative   (  Normal Range: Negative) Spec. Gravity: 1.015   (Normal Range: 1.003-1.035) Blood: negative   (Normal Range: Negative) pH: 6.5   (Normal Range: 5.0-8.0) Protein: negative   (Normal Range: Negative) Urobilinogen: 0.2   (Normal Range: 0-1) Nitrite: negative   (Normal Range: Negative) Leukocyte Esterace: negative   (Normal Range: Negative)      Wet Mount/KOH  Rods KOH Negative

## 2011-01-09 NOTE — Assessment & Plan Note (Signed)
Summary: cpx/kdc   Vital Signs:  Patient profile:   50 year old female Height:      64.5 inches Weight:      153.2 pounds BMI:     25.98 Pulse rate:   60 / minute BP sitting:   100 / 60  Vitals Entered By: Shary Decamp (March 20, 2010 1:16 PM)  History of Present Illness: CPX overall doing well she was seen by gynecology recently, she was tired, her vitamin D was low , she is taking vitamin D weekly.  Feels slightly more energetic  also was told she has mild anemia per gynecology she is status post treatment for DUB, essentially has  no periods when asked, she admits to some dyspepsia, "bloated after meals " but denies epigastric pain, and GERD symptoms.  Rarely she takes NSAIDs    Preventive Screening-Counseling & Management  Caffeine-Diet-Exercise     Caffeine use/day: 3     Times/week: 3      Drug Use:  no.    Current Medications (verified): 1)  Lipitor 20 Mg Tabs (Atorvastatin Calcium) .Marland Kitchen.. 1 Tablet By Mouth Once Daily 2)  Sertraline Hcl 100 Mg Tabs (Sertraline Hcl) .Marland Kitchen.. 1 By Mouth 3)  Lorazepam 0.5 Mg  Tabs (Lorazepam) .Marland Kitchen.. 1 By Mouth At Bedtime Prn 4)  Multivitamins  Tabs (Multiple Vitamin) .Marland Kitchen.. 1 Tablet By Mouth Once Daily 5)  Calcium 500 Mg Tabs (Calcium Carbonate) .Marland Kitchen.. 1 Tablet By Mouth Once Daily 6)  Vit D3 7)  Mvi  Allergies (verified): 1)  * Penicillins Group 2)  Codeine Phosphate (Codeine Phosphate) 3)  * Sulfa (Sulfonamides) Group  Past History:  Past Medical History: G3 P2 ab 1 Hyperlipidemia Endometriosis. s/p exploration  DUB perimenopausal, Dx 3/11 Anxiety--Depression Hemorrhoids Hyperplastic polyps 10/2004, Normal Cscope 2010, next 2010  Past Surgical History: Tubal ligation Appendectomy breast lift-implant 2009,  repeated  08-2009 Ankle surgery for repair of fracture Uterine ablation  Family History: Reviewed history from 04/16/2009 and no changes required. 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 Resp:  Denies cough and shortness of breath. GI:  Denies bloody stools, nausea, and vomiting; stil has some constipation. Psych:  anxiety-depression -- symptoms well controlled .  Physical Exam  General:  alert, well-developed, and well-nourished.   Neck:  no masses, no thyromegaly, and normal carotid upstroke.   Lungs:  normal respiratory effort, no intercostal retractions, no accessory muscle use, and normal breath sounds.   Heart:  normal rate, regular rhythm, no murmur, and no gallop.   Abdomen:  soft, non-tender, no distention, no masses, no guarding, and no rigidity.   Extremities:  no edema Psych:  Cognition and judgment appear intact. Alert and cooperative with normal attention span and concentration. not anxious appearing and not depressed appearing.     Impression & Recommendations:  Problem # 1:  HEALTH SCREENING (ICD-V70.0) Td 2004  just saw gyn last week,  told she had minimal anemia -- check CBC, iron; if she is iron deficient will need further GI evaluation ( essentially has no periods)  DEXA done once at gyn aprox 3 years ago   Cscope 11-05 + polyps, colonoscopy again 09/2009, normal.  Next 2020   continue with her healthy lifestyle  Problem # 2:  LUNG CANCER, FAMILY HX (ICD-V16.1) patient has a strong family history of lung cancer, she quit tobacco approximately 14 to 16 years ago. both the patient and he are concerned about this issue, we'll contact pulmonology: if she candidate for a screening CT of  the chest?  How often?  Complete Medication List: 1)  Lipitor 20 Mg Tabs (Atorvastatin calcium) .Marland Kitchen.. 1 tablet by mouth once daily 2)  Sertraline Hcl 100 Mg Tabs (Sertraline hcl) .Marland Kitchen.. 1 by mouth 3)  Lorazepam 0.5 Mg Tabs (Lorazepam) .Marland Kitchen.. 1 by mouth at bedtime prn 4)  Multivitamins Tabs (Multiple vitamin) .Marland Kitchen.. 1 tablet by mouth once daily 5)  Calcium 500 Mg Tabs (Calcium carbonate) .Marland Kitchen.. 1 tablet by mouth once daily 6)  Vit D3  7)  Mvi   Patient Instructions: 1)  come back fasting 2)  FLP , CBC, iron ferritin, BMP, TSH, AST, ALT ----Dx V70 3)  Please schedule a follow-up appointment in 6 months .  Prescriptions: LORAZEPAM 0.5 MG  TABS (LORAZEPAM) 1 by mouth at bedtime prn  #30 x 6   Entered and Authorized by:   Elita Quick E. Lucila Klecka MD   Signed by:   Nolon Rod. Gearld Kerstein MD on 03/20/2010   Method used:   Print then Give to Patient   RxID:   [id number]    Preventive Care Screening  Mammogram:    Date:  01/08/2010    Results:  normal   Pap Smear:    Date:  01/08/2010    Results:  normal     Risk Factors:  Tobacco use:  quit    Year quit:  1998 Passive smoke exposure:  no Drug use:  no Caffeine use:  3 drinks per day Alcohol use:  yes    Drinks per day:  <1    Comments:  socially Exercise:  yes    Times per week:  3    Type:  walks - gym  Colonoscopy History:    Date of Last Colonoscopy:  10/05/2009  Mammogram History:     Date of Last Mammogram:  01/08/2010    Results:  normal   PAP Smear History:     Date of Last PAP Smear:  01/08/2010    Results:  normal    Appended Document: cpx/kdc in reference to lung cancer screening, this was discussed (informally) with pulmonary: --current ATS guidelines do  not support CTs for lung cancer screening --an approach to  this could  be periodic chest x-rays --If the pt feels strongly, there may be some support in the literature for CT scan.  plan: Schedule chest x-ray, DX family history of lung cancer, history of tobacco  abuse we can discuss further on return to the office   Appended Document: Orders Update discussed with pt Shary Decamp  April 04, 2010 1:56 PM      Clinical Lists Changes  Problems: Added new problem of History of  TOBACCO ABUSE (ICD-305.1) Orders: Added new Test order of T-2 View CXR ([id number]) - Signed

## 2011-01-09 NOTE — Progress Notes (Signed)
Summary: Refill Request  Phone Note Refill Request Call back at [phone] Message from:  Pharmacy on September 06, 2010 8:09 AM  Refills Requested: Medication #1:  LORAZEPAM 0.5 MG  TABS 1 by mouth at bedtime prn   Dosage confirmed as above?Dosage Confirmed   Supply Requested: 1 month   Last Refilled: 08/10/2010   Notes: [facility]  Next Appointment Scheduled: none Initial call taken by: Harold Barban,  September 06, 2010 8:10 AM  Follow-up for Phone Call        Last filled 03/20/10 x 6 last ov- 04/23/10 acute  Follow-up by: Army Fossa CMA,  September 06, 2010 8:30 AM  Additional Follow-up for Phone Call Additional follow up Details #1::        okay #30 and 6 rf Additional Follow-up by: Jose E. Paz MD,  September 06, 2010 2:56 PM    Prescriptions: LORAZEPAM 0.5 MG  TABS (LORAZEPAM) 1 by mouth at bedtime prn  #30 x 6   Entered by:   Army Fossa CMA   Authorized by:   Nolon Rod. Paz MD   Signed by:   Army Fossa CMA on 09/06/2010   Method used:   Printed then faxed to ...       [facility] [phone]* (retail)       [address]       [place], [place]  [address]       Ph: [phone]       Fax: [phone]   RxID:   [id number]

## 2011-01-09 NOTE — Progress Notes (Signed)
Summary: due cpx  Phone Note Outgoing Call Call back at [facility] Phone [phone] Call back at Work Phone [phone]   Summary of Call: DUE CPX Shary Decamp  February 25, 2010 2:18 PM     Additional Follow-up for Phone Call Additional follow up Details #2::    LMTCB  patient has an appt on April 13,2011.Marland KitchenMarland KitchenBarb Merino  February 27, 2010 9:16 AM  Follow-up by: Barb Merino,  February 26, 2010 8:24 AM

## 2011-01-09 NOTE — Progress Notes (Signed)
Summary: Refill Request  Phone Note Refill Request Call back at [phone] Message from:  Pharmacy on May 09, 2010 10:24 AM  Refills Requested: Medication #1:  SERTRALINE HCL 100 MG TABS 1 by mouth   Dosage confirmed as above?Dosage Confirmed   Supply Requested: 3 months   Notes: 1 refill [facility]  Next Appointment Scheduled: none Initial call taken by: Harold Barban,  May 09, 2010 10:24 AM    Prescriptions: SERTRALINE HCL 100 MG TABS (SERTRALINE HCL) 1 by mouth  #90 x 0   Entered by:   Jeremy Johann CMA   Authorized by:   Nolon Rod. Paz MD   Signed by:   Jeremy Johann CMA on 05/09/2010   Method used:   Faxed to ...       [facility]* (mail-order)             ,          Ph: [phone]       Fax: [phone]   RxID:   [id number]

## 2011-01-23 ENCOUNTER — Telehealth (INDEPENDENT_AMBULATORY_CARE_PROVIDER_SITE_OTHER): Payer: Self-pay | Admitting: *Deleted

## 2011-01-27 ENCOUNTER — Other Ambulatory Visit: Payer: Self-pay | Admitting: Internal Medicine

## 2011-01-27 ENCOUNTER — Ambulatory Visit (INDEPENDENT_AMBULATORY_CARE_PROVIDER_SITE_OTHER): Payer: BC Managed Care – PPO | Admitting: Internal Medicine

## 2011-01-27 ENCOUNTER — Encounter: Payer: Self-pay | Admitting: Internal Medicine

## 2011-01-27 DIAGNOSIS — E785 Hyperlipidemia, unspecified: Secondary | ICD-10-CM

## 2011-01-27 DIAGNOSIS — R5381 Other malaise: Secondary | ICD-10-CM

## 2011-01-27 DIAGNOSIS — F329 Major depressive disorder, single episode, unspecified: Secondary | ICD-10-CM

## 2011-01-27 DIAGNOSIS — R5383 Other fatigue: Secondary | ICD-10-CM

## 2011-01-27 DIAGNOSIS — F3289 Other specified depressive episodes: Secondary | ICD-10-CM

## 2011-01-27 DIAGNOSIS — N760 Acute vaginitis: Secondary | ICD-10-CM

## 2011-01-27 LAB — BASIC METABOLIC PANEL
BUN: 11 mg/dL (ref 6–23)
Calcium: 9.4 mg/dL (ref 8.4–10.5)
GFR: 83.91 mL/min (ref >60.00)
Potassium: 4.6 mEq/L (ref 3.5–5.1)

## 2011-01-27 LAB — AST: AST: 18 U/L (ref 0–37)

## 2011-01-27 LAB — LIPID PANEL: Cholesterol: 199 mg/dL (ref 0–200)

## 2011-01-29 NOTE — Progress Notes (Signed)
Summary: rx--  FYI--has appt for 2/20  Phone Note Refill Request Call back at Work Phone [phone] Message from:  Patient on January 23, 2011 9:01 AM  Refills Requested: Medication #1:  LIPITOR 40 MG TABS 1 tablet by mouth once daily   Dosage confirmed as above?Dosage Confirmed   Supply Requested: 1 month one month [facility] [address] then 3 month sent to [facility]  Initial call taken by: Freddy Jaksch,  January 23, 2011 9:02 AM  Follow-up for Phone Call        Not been seen since May (acute). No pending appts. Follow-up by: Army Fossa CMA,  January 23, 2011 9:07 AM  Additional Follow-up for Phone Call Additional follow up Details #1::        ok 30, no RF needs a office  visit before next refill Additional Follow-up by: Jose E. Paz MD,  January 23, 2011 12:29 PM    Additional Follow-up for Phone Call Additional follow up Details #2::    Please call and schedule OV. Army Fossa CMA  January 23, 2011 1:04 PM  Patient has appointment to see Dr Drue Novel on Monday 01/27/2011 at 10:00am..Marland KitchenJerolyn Shin  January 24, 2011 2:31 PM   Prescriptions: LIPITOR 20 MG TABS (ATORVASTATIN CALCIUM) 1 tablet by mouth once daily  #30 x 0   Entered by:   Army Fossa CMA   Authorized by:   Nolon Rod. Paz MD   Signed by:   Army Fossa CMA on 01/23/2011   Method used:   Electronically to        [id number] (retail)       [address] [place], [place]  [address]       Ph: [phone]       Fax: [phone]   RxID:   [id number]

## 2011-02-04 NOTE — Assessment & Plan Note (Signed)
Summary: office visit per Dr Case Vassell///sph   Vital Signs:  Patient profile:   50 year old female Height:      64.5 inches Weight:      157 pounds BMI:     26.63 Pulse rate:   72 / minute Pulse rhythm:   regular BP sitting:   116 / 84  (left arm) Cuff size:   regular  Vitals Entered By: Army Fossa CMA (January 27, 2011 10:12 AM) CC: follow up visit- fasting  Comments no complaints [facility] [address]   History of Present Illness: ROV had a number of symptoms , see last OV, in general much imprivment noted  ROS no N-V-D no blood in stools no vag d/c-bleed-rash poor lipitor compliance , takes 1/2 40mg  tab "sometimes"; states is b/c her chol has been good lately diet ok although admits to enjoy  sweats   Current Medications (verified): 1)  Lipitor 20 Mg Tabs (Atorvastatin Calcium) .Marland Kitchen.. 1 Tablet By Mouth Once Daily 2)  Sertraline Hcl 50 Mg Tabs (Sertraline Hcl) .Marland Kitchen.. 1 By Mouth Once Daily 3)  Lorazepam 0.5 Mg  Tabs (Lorazepam) .Marland Kitchen.. 1 By Mouth At Bedtime Prn 4)  Multivitamins  Tabs (Multiple Vitamin) .Marland Kitchen.. 1 Tablet By Mouth Once Daily 5)  Calcium 500 Mg Tabs (Calcium Carbonate) .Marland Kitchen.. 1 Tablet By Mouth Once Daily 6)  Mvi  Allergies (verified): 1)  * Penicillins Group 2)  Codeine Phosphate (Codeine Phosphate) 3)  * Sulfa (Sulfonamides) Group  Past History:  Past Medical History: Reviewed history from 03/20/2010 and no changes required. G3 P2 ab 1 Hyperlipidemia Endometriosis. s/p exploration  DUB perimenopausal, Dx 3/11 Anxiety--Depression Hemorrhoids Hyperplastic polyps 10/2004, Normal Cscope 2010, next 2010  Past Surgical History: Reviewed history from 03/20/2010 and no changes required. Tubal ligation Appendectomy breast lift-implant 2009,  repeated  08-2009 Ankle surgery for repair of fracture Uterine ablation  Social History: Reviewed history from 03/20/2010 and no changes required. 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   Lungs:  normal respiratory effort, no intercostal retractions, no accessory muscle use, and normal breath sounds.   Heart:  normal rate, regular rhythm, no murmur, and no gallop.   Abdomen:  soft, no distention, no masses, no guarding, and no rigidity.   no tender    Impression & Recommendations:  Problem # 1:  VAGINITIS (ICD-616.10) symptoms improved, ROS neg   Problem # 2:  FATIGUE (ICD-780.79) still occasionally fatigued, labs  Orders: Venipuncture ([address]) TLB-Hemoglobin (Hgb) (85018-HGB) TLB-BMP (Basic Metabolic Panel-BMET) (80048-METABOL) Specimen Handling (60454) Specimen Handling ([address])  Problem # 3:  DEPRESSION (ICD-311) well controlled  Her updated medication list for this problem includes:    Sertraline Hcl 50 Mg Tabs (Sertraline hcl) .Marland Kitchen... 1 by mouth once daily    Lorazepam 0.5 Mg Tabs (Lorazepam) .Marland Kitchen... 1 by mouth at bedtime prn  Problem # 4:  HYPERLIPIDEMIA (ICD-272.4) poor med compliance, see HPI labs if not at goal , we agreed to restart meds once daily (10mg  instead of 20mg ??)  Her updated medication list for this problem includes:    Lipitor 20 Mg Tabs (Atorvastatin calcium) .Marland Kitchen... 1 tablet by mouth once daily  Orders: TLB-Lipid Panel (80061-LIPID) TLB-ALT (SGPT) (84460-ALT) TLB-AST (SGOT) (84450-SGOT) Specimen Handling (91478)  Labs Reviewed: SGOT: 22 (04/09/2010)   SGPT:  8 (04/09/2010)   HDL:66.00 (04/19/2010), 62.00 (02/26/2009)  LDL:104 (04/19/2010), 99 (04/54/0981)  Chol:180 (04/19/2010), 184 (02/26/2009)  Trig:52.0 (04/19/2010), 113.0 (02/26/2009)  Complete Medication List: 1)  Lipitor 20 Mg Tabs (Atorvastatin calcium) .Marland Kitchen.. 1 tablet by mouth once daily 2)  Sertraline Hcl 50 Mg Tabs (Sertraline hcl) .Marland Kitchen.. 1 by mouth once daily 3)  Lorazepam 0.5 Mg  Tabs (Lorazepam) .Marland Kitchen.. 1 by mouth at bedtime prn 4)  Multivitamins Tabs (Multiple vitamin) .Marland Kitchen.. 1 tablet by mouth once daily 5)  Calcium 500 Mg Tabs (Calcium carbonate) .Marland Kitchen.. 1 tablet by mouth once daily 6)  Mvi   Patient Instructions: 1)  Please schedule a follow-up appointment in 3 months . Physical exam  Prescriptions: LIPITOR 20 MG TABS (ATORVASTATIN CALCIUM) 1 tablet by mouth once daily  #90 x 1   Entered by:   Army Fossa CMA   Authorized by:   Nolon Rod. Schawn Byas MD   Signed by:   Army Fossa CMA on 01/27/2011   Method used:   Faxed to ...       [facility] (mail-order)             , [place]         Ph: [phone]       Fax: [phone]   RxID:   [id number]    Orders Added: 1)  Venipuncture [02725] 2)  TLB-Lipid Panel [80061-LIPID] 3)  TLB-ALT (SGPT) [84460-ALT] 4)  TLB-AST (SGOT) [84450-SGOT] 5)  TLB-Hemoglobin (Hgb) [85018-HGB] 6)  TLB-BMP (Basic Metabolic Panel-BMET) [80048-METABOL] 7)  Specimen Handling [99000] 8)  Est. Patient Level III [36644]

## 2011-02-17 ENCOUNTER — Telehealth: Payer: Self-pay | Admitting: Internal Medicine

## 2011-02-25 NOTE — Progress Notes (Signed)
Summary: Lorazepam refill  Phone Note Refill Request Message from:  Fax from Pharmacy on February 17, 2011 2:10 PM  Refills Requested: Medication #1:  LORAZEPAM 0.5 MG  TABS 1 by mouth at bedtime prn [id number]  [address] [place], [place]  phone [phone], fax [phone]   qty = 30  Next Appointment Scheduled: none Initial call taken by: Jerolyn Shin,  February 17, 2011 2:10 PM  Follow-up for Phone Call        last refilled 09/06/10 x 6 Follow-up by: Army Fossa CMA,  February 17, 2011 2:19 PM  Additional Follow-up for Phone Call Additional follow up Details #1::        ok 30, 3 RF Additional Follow-up by: Bowie Delia E. Riya Huxford MD,  February 17, 2011 5:02 PM    Prescriptions: LORAZEPAM 0.5 MG  TABS (LORAZEPAM) 1 by mouth at bedtime prn  #30 x 3   Entered by:   Doristine Devoid CMA   Authorized by:   Nolon Rod. Matson Welch MD   Signed by:   Doristine Devoid CMA on 02/18/2011   Method used:   Printed then faxed to ...       [facility] [phone]* (retail)       [address]       [place], [place]  [address]       Ph: [phone]       Fax: [phone]   RxID:   [id number]

## 2011-03-03 LAB — COMPREHENSIVE METABOLIC PANEL
ALT: 6 U/L (ref 0–35)
AST: 20 U/L (ref 0–37)
Albumin: 4.3 g/dL (ref 3.5–5.2)
Alkaline Phosphatase: 59 U/L (ref 39–117)
Chloride: 104 mEq/L (ref 96–112)
GFR calc Af Amer: >60 mL/min (ref >60)
Potassium: 4 mEq/L (ref 3.5–5.1)
Sodium: 143 mEq/L (ref 135–145)
Total Bilirubin: 0.7 mg/dL (ref 0.3–1.2)
Total Protein: 7.4 g/dL (ref 6.0–8.3)

## 2011-03-03 LAB — URINE MICROSCOPIC-ADD ON

## 2011-03-03 LAB — CBC
HCT: 40.2 % (ref 36.0–46.0)
Platelets: 170 10*3/uL (ref 150–400)
RDW: 12 % (ref 11.5–15.5)
WBC: 12.1 10*3/uL — ABNORMAL HIGH (ref 4.0–10.5)

## 2011-03-03 LAB — DIFFERENTIAL
Basophils Absolute: 0 10*3/uL (ref 0.0–0.1)
Basophils Relative: 0 % (ref 0–1)
Eosinophils Relative: 0 % (ref 0–5)
Monocytes Absolute: 0.5 10*3/uL (ref 0.1–1.0)
Monocytes Relative: 4 % (ref 3–12)

## 2011-03-03 LAB — URINALYSIS, ROUTINE W REFLEX MICROSCOPIC
Bilirubin Urine: NEGATIVE
Glucose, UA: NEGATIVE mg/dL
Protein, ur: NEGATIVE mg/dL
Specific Gravity, Urine: 1.025 (ref 1.005–1.030)
Urobilinogen, UA: 0.2 mg/dL (ref 0.0–1.0)

## 2011-03-03 LAB — URINE CULTURE

## 2011-03-13 ENCOUNTER — Telehealth: Payer: Self-pay | Admitting: Internal Medicine

## 2011-03-13 NOTE — Telephone Encounter (Signed)
Patient is going out of town this Saturday and will run out of her Lorazepam 0.5 mg before she gets back from her vacation  (will be 4 pills short)   ----Pharmacy said she needed to contact her doctors office because it is a little early to be reordering this refill---please [facility]

## 2011-03-13 NOTE — Telephone Encounter (Signed)
Ok to get a early RF

## 2011-03-13 NOTE — Telephone Encounter (Signed)
[facility], they will fill early for pt. Pt is aware.
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
## 2011-06-09 ENCOUNTER — Other Ambulatory Visit: Payer: Self-pay | Admitting: *Deleted

## 2011-06-09 MED ORDER — LORAZEPAM 0.5 MG PO TABS: 0.5000 mg | ORAL_TABLET | Freq: Every evening | ORAL | Status: DC | PRN

## 2011-06-09 NOTE — Telephone Encounter (Signed)
Ok 30, 1 RF, also advise pt she is due for a check up

## 2011-07-16 ENCOUNTER — Encounter: Payer: Self-pay | Admitting: Internal Medicine

## 2011-07-16 ENCOUNTER — Ambulatory Visit (INDEPENDENT_AMBULATORY_CARE_PROVIDER_SITE_OTHER): Payer: BC Managed Care – PPO | Admitting: Internal Medicine

## 2011-07-16 DIAGNOSIS — E785 Hyperlipidemia, unspecified: Secondary | ICD-10-CM

## 2011-07-16 DIAGNOSIS — Z131 Encounter for screening for diabetes mellitus: Secondary | ICD-10-CM

## 2011-07-16 DIAGNOSIS — D649 Anemia, unspecified: Secondary | ICD-10-CM

## 2011-07-16 DIAGNOSIS — F411 Generalized anxiety disorder: Secondary | ICD-10-CM

## 2011-07-16 DIAGNOSIS — E119 Type 2 diabetes mellitus without complications: Secondary | ICD-10-CM

## 2011-07-16 LAB — CBC WITH DIFFERENTIAL/PLATELET
Basophils Relative: 0.8 % (ref 0.0–3.0)
Eosinophils Relative: 1 % (ref 0.0–5.0)
Lymphocytes Relative: 33.4 % (ref 12.0–46.0)
MCV: 93.1 fl (ref 78.0–100.0)
Monocytes Relative: 5.7 % (ref 3.0–12.0)
Neutrophils Relative %: 59.1 % (ref 43.0–77.0)
Platelets: 227 10*3/uL (ref 150.0–400.0)
RBC: 4.05 Mil/uL (ref 3.87–5.11)
WBC: 5.2 10*3/uL (ref 4.5–10.5)

## 2011-07-16 LAB — LIPID PANEL
HDL: 66.4 mg/dL (ref >39.00)
Total CHOL/HDL Ratio: 4
Triglycerides: 149 mg/dL (ref 0.0–149.0)

## 2011-07-16 LAB — GLUCOSE, RANDOM: Glucose, Bld: 92 mg/dL (ref 70–99)

## 2011-07-16 LAB — LDL CHOLESTEROL, DIRECT: Direct LDL: 164 mg/dL

## 2011-07-16 NOTE — Assessment & Plan Note (Signed)
Mendibles history of high cholesterol, she was taking Lipitor inconsistently and based on the last cholesterol we decided to stop it altogether. FLP today and see how she's doing.

## 2011-07-16 NOTE — Progress Notes (Signed)
  Subjective:    Patient ID: Adrienne Burke, female    DOB: July 02, 1961, 50 y.o.   MRN: [id number]  HPI Routine office visit Feeling well except for some sinus congestion for the last 2 months, symptoms actually have improved in the last 2 weeks.  Past Medical History: G3 P2 ab 1 Hyperlipidemia Endometriosis. 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 Denies any fevers, itchy nose, itchy eyes. She has a mild sore throat, thinks is related to postnasal dripping. Anxiety well controlled with Ativan and sertraline as needed     Objective:   Physical Exam  Constitutional: She appears well-developed and well-nourished. No distress.  HENT:  Head: Normocephalic and atraumatic.  Right Ear: External ear normal.  Left Ear: External ear normal.  Nose: Nose normal.       Face  not tender to palpation  Cardiovascular: Normal rate, regular rhythm and normal heart sounds.   No murmur heard. Pulmonary/Chest: Effort normal and breath sounds normal. No respiratory distress. She has no wheezes. She has no rales.  Musculoskeletal: She exhibits no edema.  Skin: She is not diaphoretic.          Assessment & Plan:  History of mild anemia, check a CBC. Has some symptoms consistent with allergies, no evidence of infection at this time. Recommend Claritin over-the-counter and saline nasal drops as needed.

## 2011-07-16 NOTE — Assessment & Plan Note (Signed)
Well-controlled with "PRN" Ativan and sertraline

## 2011-07-22 ENCOUNTER — Telehealth: Payer: Self-pay | Admitting: *Deleted

## 2011-07-22 NOTE — Telephone Encounter (Signed)
Pt requesting lab results from 07/16/11 OV.  Pt informed mailed on 07/18/11 per chart note. Informed Pt of MD advice on results via telephone.

## 2011-08-01 ENCOUNTER — Other Ambulatory Visit: Payer: Self-pay | Admitting: Internal Medicine

## 2011-08-01 NOTE — Telephone Encounter (Signed)
Lorazepam request [last refilled 06/09/11 #30x1]

## 2011-08-01 NOTE — Telephone Encounter (Signed)
Ok #30, 3 RF 

## 2011-08-04 MED ORDER — LORAZEPAM 0.5 MG PO TABS: 0.5000 mg | ORAL_TABLET | Freq: Every evening | ORAL | Status: DC | PRN

## 2011-08-04 NOTE — Telephone Encounter (Signed)
Patient called to check status of prescrptions--asks if it can be called in today--she is out

## 2011-08-04 NOTE — Telephone Encounter (Signed)
Addended by: Doristine Devoid on: 08/04/2011 03:59 PM   Modules accepted: Orders

## 2011-08-04 NOTE — Telephone Encounter (Signed)
Spoke with Revonda Standard who switched me to Tammy the Pharmicist to authorize refills on Lorazepam 0.5mg  #30 with 3 refills.

## 2011-08-06 ENCOUNTER — Other Ambulatory Visit: Payer: Self-pay | Admitting: *Deleted

## 2011-08-06 MED ORDER — LORAZEPAM 0.5 MG PO TABS: 0.5000 mg | ORAL_TABLET | Freq: Every evening | ORAL | Status: DC | PRN

## 2011-12-05 ENCOUNTER — Encounter: Payer: Self-pay | Admitting: Internal Medicine

## 2011-12-05 ENCOUNTER — Ambulatory Visit (INDEPENDENT_AMBULATORY_CARE_PROVIDER_SITE_OTHER): Payer: BC Managed Care – PPO | Admitting: Internal Medicine

## 2011-12-05 VITALS — BP 112/70 | HR 69 | Temp 97.0°F | Wt 156.0 lb

## 2011-12-05 DIAGNOSIS — N390 Urinary tract infection, site not specified: Secondary | ICD-10-CM | POA: Insufficient documentation

## 2011-12-05 DIAGNOSIS — R300 Dysuria: Secondary | ICD-10-CM

## 2011-12-05 LAB — POCT URINALYSIS DIPSTICK
Glucose, UA: NEGATIVE
Nitrite, UA: POSITIVE
Urobilinogen, UA: 4

## 2011-12-05 MED ORDER — CIPROFLOXACIN HCL 500 MG PO TABS: 500.0000 mg | ORAL_TABLET | Freq: Two times a day (BID) | ORAL | Status: AC

## 2011-12-05 NOTE — Assessment & Plan Note (Signed)
Symptoms and urinalysis consistent with UTI. Information regards this condition provided, encouraged to take fluids, start Cipro. Will call if not better soon.

## 2011-12-05 NOTE — Progress Notes (Signed)
  Subjective:    Patient ID: Adrienne Burke, female    DOB: March 19, 1961, 50 y.o.   MRN: [id number]  HPI Symptoms started yesterday: Urethral pain, dysuria described as burning, lower back pain.  Past Medical History:  G3 P2 ab 1  Hyperlipidemia  Endometriosis. s/p exploration  DUB  perimenopausal, Dx 3/11  Anxiety--Depression  Hemorrhoids  Hyperplastic polyps 10/2004, Normal Cscope 2010, next 2010   Past Surgical History:  Tubal ligation  Appendectomy  breast lift-implant 2009, repeated 08-2009  Ankle surgery for repair of fracture  Uterine ablation   Review of Systems Denies nausea, vomiting. Mild diarrhea. Had subjective fever and few chills. Sheet so traces of blood in the urine. Denies any vaginal discharge, bleeding or genital rash      Objective:   Physical Exam  Constitutional: She is oriented to person, place, and time. She appears well-developed and well-nourished.  HENT:  Head: Normocephalic.  Cardiovascular: Normal rate, regular rhythm and normal heart sounds.   No murmur heard. Pulmonary/Chest: Effort normal and breath sounds normal. No respiratory distress. She has no wheezes. She has no rales.  Abdominal:       Mild suprapubic discomfort with palpation without mass or rebound. No CVA tenderness.   Musculoskeletal: She exhibits no edema.  Neurological: She is alert and oriented to person, place, and time.       Assessment & Plan:

## 2011-12-05 NOTE — Patient Instructions (Signed)
cipro x 5 days  Call if no better soon, call if severe symptoms, fever , chills   Urinary Tract Infection Infections of the urinary tract can start in several places. A bladder infection (cystitis), a kidney infection (pyelonephritis), and a prostate infection (prostatitis) are different types of urinary tract infections (UTIs). They usually get better if treated with medicines (antibiotics) that kill germs. Take all the medicine until it is gone. You or your child may feel better in a few days, but TAKE ALL MEDICINE or the infection may not respond and may become more difficult to treat. HOME CARE INSTRUCTIONS   Drink enough water and fluids to keep the urine clear or pale yellow. Cranberry juice is especially recommended, in addition to large amounts of water.   Avoid caffeine, tea, and carbonated beverages. They tend to irritate the bladder.   Alcohol may irritate the prostate.   Only take over-the-counter or prescription medicines for pain, discomfort, or fever as directed by your caregiver.  To prevent further infections:  Empty the bladder often. Avoid holding urine for Keefe periods of time.   After a bowel movement, women should cleanse from front to back. Use each tissue only once.   Empty the bladder before and after sexual intercourse.  FINDING OUT THE RESULTS OF YOUR TEST Not all test results are available during your visit. If your or your child's test results are not back during the visit, make an appointment with your caregiver to find out the results. Do not assume everything is normal if you have not heard from your caregiver or the medical facility. It is important for you to follow up on all test results. SEEK MEDICAL CARE IF:   There is back pain.   Your baby is older than 3 months with a rectal temperature of 100.5 F (38.1 C) or higher for more than 1 day.   Your or your child's problems (symptoms) are no better in 3 days. Return sooner if you or your child is  getting worse.  SEEK IMMEDIATE MEDICAL CARE IF:   There is severe back pain or lower abdominal pain.   You or your child develops chills.   You have a fever.   Your baby is older than 3 months with a rectal temperature of 102 F (38.9 C) or higher.   Your baby is 8 months old or younger with a rectal temperature of 100.4 F (38 C) or higher.   There is nausea or vomiting.   There is continued burning or discomfort with urination.  MAKE SURE YOU:   Understand these instructions.   Will watch your condition.   Will get help right away if you are not doing well or get worse.  Document Released: 09/03/2005 Document Revised: 08/06/2011 Document Reviewed: 04/08/2007 [facility] Patient Information 2012 [place], [place].

## 2011-12-07 ENCOUNTER — Encounter: Payer: Self-pay | Admitting: Internal Medicine

## 2011-12-07 LAB — URINE CULTURE: Organism ID, Bacteria: NO GROWTH

## 2011-12-16 ENCOUNTER — Other Ambulatory Visit: Payer: Self-pay | Admitting: Internal Medicine

## 2011-12-16 NOTE — Telephone Encounter (Signed)
Last OV 12-05-11, last filled 08-06-11 #30 3

## 2011-12-17 MED ORDER — LORAZEPAM 0.5 MG PO TABS: 0.5000 mg | ORAL_TABLET | Freq: Every evening | ORAL | Status: DC | PRN

## 2011-12-17 NOTE — Telephone Encounter (Signed)
Ok 30 and 3 RF 

## 2011-12-17 NOTE — Telephone Encounter (Signed)
Rx sent 

## 2012-01-29 ENCOUNTER — Telehealth: Payer: Self-pay | Admitting: Internal Medicine

## 2012-01-29 NOTE — Telephone Encounter (Signed)
Patient sent me a letter w/recent labs done at work: Total cholesterol 259, HDL 77, LDL 152. Hemoglobin A1c 5.6 BMI 26, blood pressure 110/74. She likes to go back to Lipitor. Her LDL goal is 130 or less. Plan:  Call the patient I agree with going back to Lipitor 20 mg each bedtime,: #30 and 3 refills. Please schedule a physical exam in 3 months, fasting. Her cell number is [phone] or [phone]

## 2012-01-30 NOTE — Telephone Encounter (Signed)
Spoke with pt. She states she has enough Lipitor left over so she does not need a refill at this time & she will call back to set appt up for a physical.

## 2012-01-30 NOTE — Telephone Encounter (Signed)
LMOVM for pt to return call 

## 2012-04-05 ENCOUNTER — Other Ambulatory Visit: Payer: Self-pay | Admitting: Internal Medicine

## 2012-04-05 NOTE — Telephone Encounter (Signed)
#  30 with 3 refills. OK to refill?

## 2012-04-05 NOTE — Telephone Encounter (Signed)
Refill done. Please call pt to schedule a CPE.

## 2012-04-05 NOTE — Telephone Encounter (Signed)
Advise patient, due for a physical exam, please arrange. Okay lorazepam #30 no refills

## 2012-04-05 NOTE — Telephone Encounter (Signed)
refill for Lorazepam 0.5MG  Tablet Take one tablet by mouth at bedtime as needed for Anxiety  Last Written 1.9.13, qty 30 Last OV  12.30.12

## 2012-04-07 NOTE — Telephone Encounter (Signed)
Spoke to Patient this morning, to accommodate her schedule her CPE is scheduled for 6.24.13 @ 8:30am.

## 2012-04-30 ENCOUNTER — Telehealth: Payer: Self-pay | Admitting: Internal Medicine

## 2012-04-30 MED ORDER — LORAZEPAM 0.5 MG PO TABS: 0.5000 mg | ORAL_TABLET | Freq: Every evening | ORAL | Status: DC | PRN

## 2012-04-30 NOTE — Telephone Encounter (Signed)
Ok to refill? #30 with 3 refills. Last filled on 1.9.13

## 2012-04-30 NOTE — Telephone Encounter (Signed)
Refill: Lorazepam 0.5mg  tablet. Take 1 tablet by mouth at bedtime as needed for anxiety.

## 2012-04-30 NOTE — Telephone Encounter (Signed)
Okay #30, 0 refills. She's due for office visit, please let her know

## 2012-04-30 NOTE — Telephone Encounter (Signed)
Refill done.  

## 2012-05-05 ENCOUNTER — Telehealth: Payer: Self-pay | Admitting: Internal Medicine

## 2012-05-05 NOTE — Telephone Encounter (Signed)
Spoke to Patient this morning, to accommodate her schedule her CPE is scheduled for 6.24.13 @ 8:30am. 

## 2012-05-26 ENCOUNTER — Other Ambulatory Visit: Payer: Self-pay | Admitting: Internal Medicine

## 2012-05-26 MED ORDER — LORAZEPAM 0.5 MG PO TABS: 0.5000 mg | ORAL_TABLET | Freq: Every evening | ORAL | Status: DC | PRN

## 2012-05-26 NOTE — Telephone Encounter (Signed)
Refill done.  

## 2012-05-26 NOTE — Telephone Encounter (Signed)
Refill Lorazepam 0.5MG  Tablet, take one tablet by mouth at bedtime as needed for anxiety NOTE last refill stated OV due now for further refills-CPE Scheduled for 6.24.13  Last wrt 5.24.13 for 30 Last ov 12.28.12

## 2012-05-26 NOTE — Telephone Encounter (Signed)
30, 3 RF 

## 2012-05-26 NOTE — Telephone Encounter (Signed)
Ok to refill 

## 2012-05-31 ENCOUNTER — Ambulatory Visit (INDEPENDENT_AMBULATORY_CARE_PROVIDER_SITE_OTHER): Payer: BC Managed Care – PPO | Admitting: Internal Medicine

## 2012-05-31 ENCOUNTER — Encounter: Payer: Self-pay | Admitting: Internal Medicine

## 2012-05-31 VITALS — BP 112/72 | HR 69 | Temp 97.6°F | Ht 64.0 in | Wt 158.0 lb

## 2012-05-31 DIAGNOSIS — Z0000 Encounter for general adult medical examination without abnormal findings: Secondary | ICD-10-CM | POA: Insufficient documentation

## 2012-05-31 LAB — COMPREHENSIVE METABOLIC PANEL
AST: 21 U/L (ref 0–37)
Albumin: 3.9 g/dL (ref 3.5–5.2)
Alkaline Phosphatase: 48 U/L (ref 39–117)
BUN: 12 mg/dL (ref 6–23)
Calcium: 9.3 mg/dL (ref 8.4–10.5)
Chloride: 102 mEq/L (ref 96–112)
Glucose, Bld: 86 mg/dL (ref 70–99)
Potassium: 4.2 mEq/L (ref 3.5–5.1)
Sodium: 136 mEq/L (ref 135–145)
Total Protein: 6.8 g/dL (ref 6.0–8.3)

## 2012-05-31 LAB — CBC WITH DIFFERENTIAL/PLATELET
Basophils Absolute: 0 10*3/uL (ref 0.0–0.1)
HCT: 36.6 % (ref 36.0–46.0)
Lymphs Abs: 1.7 10*3/uL (ref 0.7–4.0)
MCV: 94.1 fl (ref 78.0–100.0)
Monocytes Absolute: 0.3 10*3/uL (ref 0.1–1.0)
Neutrophils Relative %: 57.2 % (ref 43.0–77.0)
Platelets: 205 10*3/uL (ref 150.0–400.0)
RDW: 13.2 % (ref 11.5–14.6)

## 2012-05-31 LAB — TSH: TSH: 1.38 u[IU]/mL (ref 0.35–5.50)

## 2012-05-31 LAB — LIPID PANEL
Cholesterol: 200 mg/dL (ref 0–200)
VLDL: 19.8 mg/dL (ref 0.0–40.0)

## 2012-05-31 NOTE — Assessment & Plan Note (Signed)
Td 2004  Sees  Gyn DEXA-- per gyn Cscope 11-05 + polyps, colonoscopy again 09/2009, normal.  Next 2020 Continue with her healthy diet, exercise discussed. Family history of lung cancer, patient is a former smoker. Reports that recently due to to a respiratory infection had a chest x-ray which was okay. We discussed potential screening tools such as CT (prior discussion with pulmonary) , she's not interested at this point, mostly due to to cost

## 2012-05-31 NOTE — Patient Instructions (Signed)
Please confirm your lipitor dose: 20 mg 1/2 tab a day Exercise -- 3 hours a week!

## 2012-05-31 NOTE — Progress Notes (Signed)
  Subjective:    Patient ID: Adrienne Burke, female    DOB: [birth year], 51 y.o.   MRN: [id number]  HPI CPX  Past Medical History:  Hyperlipidemia  Anxiety--Depression  G3 P2 ab 1  Endometriosis. s/p exploration  perimenopausal, Dx 3/11  Hemorrhoids  Hyperplastic polyps 10/2004, Normal Cscope 2010, next 2010   Past Surgical History:  Tubal ligation  Appendectomy  breast lift-implant 2009, repeated 08-2009  Ankle surgery for repair of fracture  Endometriosis. 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 Analyst diet-- healthy exercise -- not frequently   Family History: HTN - MGF, MGM CAD - MGF (first MI in his late 60s, 54s) lung Ca - M, F DM - M colon Ca - PGF breast Ca - no liver Ca - PGF M - deceased, lung Ca, emphysema, COPD +smoker F - deceased, lung Ca +smoker  Review of Systems 6 months ago, after her cholesterol is elevated when screened at work, she went back to Lipitor, half tablet daily. Diet is healthy, not exercising much. No chest pain or shortness or breath No nausea, vomiting, diarrhea or blood in the stools. No abdominal pain, she still has occasional constipation. No dysuria gross hematuria Depression anxiety well controlled with current medications. No vaginal bleeding or vaginal discharge    Objective:   Physical Exam  General -- alert, well-developed, and well-nourished.   Neck --no thyromegaly , normal carotid pulse Lungs -- normal respiratory effort, no intercostal retractions, no accessory muscle use, and normal breath sounds.   Heart-- normal rate, regular rhythm, no murmur, and no gallop.   Abdomen--soft, non-tender, no distention, no masses, no HSM, no guarding, and no rigidity.   Extremities-- no pretibial edema bilaterally Neurologic-- alert & oriented X3 and  strength normal in all extremities. Psych-- Cognition and judgment appear intact. Alert and cooperative with normal attention span and concentration.  not anxious appearing and not depressed appearing.       Assessment & Plan:

## 2012-06-03 ENCOUNTER — Encounter: Payer: Self-pay | Admitting: Internal Medicine

## 2012-08-02 ENCOUNTER — Other Ambulatory Visit: Payer: Self-pay | Admitting: Endodontics

## 2012-09-09 ENCOUNTER — Other Ambulatory Visit: Payer: Self-pay | Admitting: Internal Medicine

## 2012-09-09 NOTE — Telephone Encounter (Signed)
Ok to refill 

## 2012-09-09 NOTE — Telephone Encounter (Signed)
done

## 2012-12-20 ENCOUNTER — Other Ambulatory Visit: Payer: Self-pay | Admitting: Internal Medicine

## 2012-12-20 NOTE — Telephone Encounter (Signed)
Refill done.  

## 2012-12-24 ENCOUNTER — Other Ambulatory Visit: Payer: Self-pay | Admitting: *Deleted

## 2012-12-24 DIAGNOSIS — F329 Major depressive disorder, single episode, unspecified: Secondary | ICD-10-CM

## 2012-12-24 DIAGNOSIS — F32A Depression, unspecified: Secondary | ICD-10-CM

## 2012-12-24 MED ORDER — SERTRALINE HCL 50 MG PO TABS: 50.0000 mg | ORAL_TABLET | ORAL | Status: DC | PRN

## 2012-12-24 NOTE — Telephone Encounter (Signed)
Refill for zoloft  x1 sent to [facility] [address] per pt request.

## 2013-01-22 ENCOUNTER — Other Ambulatory Visit: Payer: Self-pay

## 2013-02-20 ENCOUNTER — Other Ambulatory Visit: Payer: Self-pay | Admitting: Internal Medicine

## 2013-02-21 NOTE — Telephone Encounter (Signed)
Refill done.  

## 2013-02-26 ENCOUNTER — Telehealth: Payer: Self-pay | Admitting: Internal Medicine

## 2013-02-28 NOTE — Telephone Encounter (Signed)
done

## 2013-02-28 NOTE — Telephone Encounter (Signed)
Ok to refill? Last OV 6.24.13. Last filled 10.3.13

## 2013-03-01 ENCOUNTER — Other Ambulatory Visit: Payer: Self-pay | Admitting: Internal Medicine

## 2013-03-01 NOTE — Telephone Encounter (Signed)
Resent rx

## 2013-03-08 LAB — HM PAP SMEAR: HM Pap smear: NORMAL

## 2013-03-08 LAB — HM MAMMOGRAPHY: HM Mammogram: NORMAL

## 2013-03-30 ENCOUNTER — Encounter: Payer: Self-pay | Admitting: Internal Medicine

## 2013-05-17 ENCOUNTER — Telehealth: Payer: Self-pay | Admitting: Internal Medicine

## 2013-05-17 NOTE — Telephone Encounter (Signed)
Patient called stating she will be going out of town for 9 days starting Saturday and will run out of her lorazepam. She states she will be 3 pills short. The pharmacy would not give her an early refill on this.  [facility]

## 2013-05-17 NOTE — Telephone Encounter (Signed)
Ok for early RF  

## 2013-05-17 NOTE — Telephone Encounter (Signed)
Spoke with pharmacy, they stated they will go ahead & refill the pt's med for her but they can't guarantee that the pt's insurance will cover it.  Pt made aware.

## 2013-05-17 NOTE — Telephone Encounter (Signed)
Okay to call pharmacy & authorize early refill? Please advise.

## 2013-08-09 ENCOUNTER — Other Ambulatory Visit: Payer: Self-pay | Admitting: Internal Medicine

## 2013-08-10 NOTE — Telephone Encounter (Signed)
Lorazepam refill Last OV 05-31-2012 Med last filled 03-01-13 330 with 5 refills

## 2013-08-12 ENCOUNTER — Telehealth: Payer: Self-pay | Admitting: *Deleted

## 2013-08-12 NOTE — Telephone Encounter (Signed)
Patient is requesting refill on lorazepam.  Last OV 05/31/13 Last filled 3/25 #30 with 5 RF No agreement on file No UDS on file  Okay to refill?

## 2013-08-12 NOTE — Telephone Encounter (Signed)
Ok 30, 1 RF  Needs to keep future appointment otherwise no more RF Let pt know.

## 2013-08-12 NOTE — Telephone Encounter (Signed)
See 08-09-2013 "refill"

## 2013-08-12 NOTE — Telephone Encounter (Signed)
Patient called requesting someone to call her when this is done. CB# [phone]

## 2013-09-05 ENCOUNTER — Encounter: Payer: Self-pay | Admitting: Internal Medicine

## 2013-09-06 ENCOUNTER — Encounter: Payer: Self-pay | Admitting: Internal Medicine

## 2013-09-09 ENCOUNTER — Other Ambulatory Visit: Payer: Self-pay | Admitting: Internal Medicine

## 2013-09-09 NOTE — Telephone Encounter (Signed)
Zoloft refill sent to pharmacy. 

## 2013-10-07 ENCOUNTER — Telehealth: Payer: Self-pay

## 2013-10-07 NOTE — Telephone Encounter (Addendum)
Left message for call back  identifiable  Tdap Flu MMG---08/2013--neg Pap--03/2013---neg CCS--09/2009--next 2020 Unable to reach prior to visit

## 2013-10-10 ENCOUNTER — Encounter: Payer: Self-pay | Admitting: Lab

## 2013-10-11 ENCOUNTER — Ambulatory Visit (INDEPENDENT_AMBULATORY_CARE_PROVIDER_SITE_OTHER): Payer: BC Managed Care – PPO | Admitting: Internal Medicine

## 2013-10-11 ENCOUNTER — Encounter: Payer: Self-pay | Admitting: Internal Medicine

## 2013-10-11 VITALS — BP 113/73 | HR 55 | Temp 98.0°F | Ht 64.0 in | Wt 155.0 lb

## 2013-10-11 DIAGNOSIS — F411 Generalized anxiety disorder: Secondary | ICD-10-CM

## 2013-10-11 DIAGNOSIS — Z0000 Encounter for general adult medical examination without abnormal findings: Secondary | ICD-10-CM

## 2013-10-11 DIAGNOSIS — Z23 Encounter for immunization: Secondary | ICD-10-CM

## 2013-10-11 DIAGNOSIS — E785 Hyperlipidemia, unspecified: Secondary | ICD-10-CM

## 2013-10-11 LAB — COMPREHENSIVE METABOLIC PANEL
ALT: 9 U/L (ref 0–35)
Albumin: 4.3 g/dL (ref 3.5–5.2)
CO2: 27 mEq/L (ref 19–32)
Calcium: 9.4 mg/dL (ref 8.4–10.5)
Chloride: 98 mEq/L (ref 96–112)
GFR: 91.01 mL/min (ref >60.00)
Glucose, Bld: 79 mg/dL (ref 70–99)
Potassium: 3.9 mEq/L (ref 3.5–5.1)
Sodium: 133 mEq/L — ABNORMAL LOW (ref 135–145)
Total Protein: 7.1 g/dL (ref 6.0–8.3)

## 2013-10-11 LAB — TSH: TSH: 1.27 u[IU]/mL (ref 0.35–5.50)

## 2013-10-11 LAB — LIPID PANEL
Cholesterol: 195 mg/dL (ref 0–200)
HDL: 73.7 mg/dL (ref >39.00)
VLDL: 13 mg/dL (ref 0.0–40.0)

## 2013-10-11 LAB — CBC WITH DIFFERENTIAL/PLATELET
Basophils Absolute: 0 10*3/uL (ref 0.0–0.1)
Eosinophils Relative: 0.6 % (ref 0.0–5.0)
Monocytes Absolute: 0.3 10*3/uL (ref 0.1–1.0)
Monocytes Relative: 6.5 % (ref 3.0–12.0)
Neutrophils Relative %: 55.2 % (ref 43.0–77.0)
Platelets: 217 10*3/uL (ref 150.0–400.0)
RDW: 13.1 % (ref 11.5–14.6)
WBC: 5 10*3/uL (ref 4.5–10.5)

## 2013-10-11 MED ORDER — LORAZEPAM 0.5 MG PO TABS: ORAL_TABLET | ORAL | Status: DC

## 2013-10-11 NOTE — Patient Instructions (Signed)
Get your blood work before you leave  Next visit in 1 year for a physical exam  . Fasting Please make an appointment     

## 2013-10-11 NOTE — Assessment & Plan Note (Signed)
Has been taking Lipitor 10 mg daily instead of 20, has improved her lifestyle, labs

## 2013-10-11 NOTE — Progress Notes (Signed)
  Subjective:    Patient ID: Adrienne Burke, female    DOB: 1961/01/15, 52 y.o.   MRN: [id number]  HPI CPX Feeling great, concerned about her right popliteal area, slightly asymmetric? No pain  Past Medical History  Diagnosis Date  . HYPERLIPIDEMIA   . Anxiety and depression   . ENDOMETRIOSIS   . Hemorrhoids   . Colon polyp     Hyperplastic polyps 10/2004, Normal Cscope 2010   . Perimenopausal dx 02-2010   Past Surgical History  Procedure Laterality Date  . Btl    . Appendectomy    . Breast implant exchange      breast lift-implant 2009, repeated 08-2009    . Ankle fracture surgery    . Pelvic exploration for endometriosis    . Uterine ablation    . Eye surgery      PRK for vision correction 02-2012   History   Social History  . Marital Status: Married    Spouse Name: N/A    Number of Children: 1  . Years of Education: N/A   Occupational History  . pay roll-tax analyst    Social History Main Topics  . Smoking status: Former Games developer  . Smokeless tobacco: Never Used     Comment: quit 1995  . Alcohol Use: Yes     Comment: socially   . Drug Use: No  . Sexual Activity: Not on file   Other Topics Concern  . Not on file   Social History Narrative   G3 P2 ab 1     Married to [place] , 1 chil alive , lost one                  Family History: HTN - MGF, MGM CAD - MGF (first MI in his late 42s, 60s) lung Ca - M, F DM - M colon Ca - PGF breast Ca - no liver Ca - PGF M - deceased, lung Ca, emphysema, COPD +smoker F - deceased, lung Ca +smoker   Review of Systems Doing great w/  lifestyle Diet-- much improved Exercise-- 5-6/week No  CP, SOB, lower extremity edema Denies  nausea, vomiting diarrhea Denies  blood in the stools (-) cough, sputum production. (-) wheezing No dysuria, gross hematuria, difficulty urinating   No anxiety, depression        Objective:   Physical Exam BP 113/73  Pulse 55  Temp(Src) 98 F (36.7 C)  Ht 5\' 4"  (1.626 m)  Wt 155 lb  (70.308 kg)  BMI 26.59 kg/m2  SpO2 99%  General -- alert, well-developed, NAD.  Neck --no thyromegaly Lungs -- normal respiratory effort, no intercostal retractions, no accessory muscle use, and normal breath sounds.  Heart-- normal rate, regular rhythm, no murmur.  Abdomen-- Not distended, good bowel sounds,soft, non-tender.  No mass,organomegaly.  Extremities-- no pretibial edema bilaterally ; calves symmetric, popliteal area on the R is indeed assymetric but no mass redness,swelling (rec observation) Neurologic--  alert & oriented X3. Speech normal, gait normal, strength normal in all extremities.  Psych-- Cognition and judgment appear intact. Cooperative with normal attention span and concentration. No anxious appearing , no depressed appearing.      Assessment & Plan:

## 2013-10-11 NOTE — Assessment & Plan Note (Addendum)
Td 2014 Got a flu shot at work [employer]-- pap, breast exam and hemocults DEXA-- per gyn? Pt is menopausal, last DEXA years ago per pt, will order a DEXA Cscope 11-05 + polyps, colonoscopy again 09/2009, normal.  Next 2020  Continue with her healthy diet, exercise discussed. Family history of lung cancer, patient is a former smoker. See previous entry, cost of CT still an issue

## 2013-10-11 NOTE — Assessment & Plan Note (Signed)
Symptoms well-controlled with Ativan when necessary, contract and UDS today

## 2013-10-13 ENCOUNTER — Other Ambulatory Visit: Payer: Self-pay

## 2013-10-21 ENCOUNTER — Telehealth: Payer: Self-pay | Admitting: Internal Medicine

## 2013-10-21 MED ORDER — AZITHROMYCIN 250 MG PO TABS: ORAL_TABLET | ORAL | Status: DC

## 2013-10-21 NOTE — Addendum Note (Signed)
Addended by: Eustace Quail on: 10/21/2013 01:37 PM   Modules accepted: Orders

## 2013-10-21 NOTE — Telephone Encounter (Signed)
Z-pak ordered and called in per dr Drue Novel. Lmovm. DJR

## 2013-10-21 NOTE — Telephone Encounter (Signed)
Okay, please call a Z-Pak. Needs office visit if she's not improving soon, has high fever or severe symptoms

## 2013-10-21 NOTE — Telephone Encounter (Signed)
Patient is calling to inquire if Dr. Drue Novel can rx a medication to her pharmacy for strep throat. She states that she was just in for an appointment with her daughter this week for strep throat and she thinks she may contracted this from her daughter. Patient states that she has not met her deductible yet and would prefer to not have to come in for an OV. Please advise.

## 2013-12-02 ENCOUNTER — Telehealth: Payer: Self-pay | Admitting: *Deleted

## 2013-12-02 NOTE — Telephone Encounter (Signed)
Patient called and wanted to see if we could look up her immunizations for her on line. She does not remember were she had her shots done at.

## 2013-12-05 ENCOUNTER — Other Ambulatory Visit: Payer: Self-pay | Admitting: Internal Medicine

## 2013-12-05 NOTE — Telephone Encounter (Signed)
LORazepam (ATIVAN) 0.5 MG tablet Last refill: 10/11/2013 #30, 1 refill Last OV: 10/11/2013 No contract on file

## 2013-12-08 HISTORY — PX: POLYPECTOMY: SHX149

## 2013-12-29 DIAGNOSIS — F329 Major depressive disorder, single episode, unspecified: Secondary | ICD-10-CM | POA: Insufficient documentation

## 2013-12-29 DIAGNOSIS — F32A Depression, unspecified: Secondary | ICD-10-CM | POA: Insufficient documentation

## 2014-01-06 ENCOUNTER — Encounter: Payer: Self-pay | Admitting: Internal Medicine

## 2014-01-27 ENCOUNTER — Other Ambulatory Visit: Payer: Self-pay | Admitting: Family Medicine

## 2014-01-27 NOTE — Telephone Encounter (Signed)
Last seen 10/11/13 and filled by Dr.Lowne on 12/05/13 #30 with 1 refill. Please advise      KP

## 2014-02-01 NOTE — Telephone Encounter (Signed)
Please advise, if this was filled

## 2014-02-09 ENCOUNTER — Telehealth: Payer: Self-pay

## 2014-02-09 NOTE — Telephone Encounter (Signed)
Request Lorazepam 0.5mg   UDS: 10/11/2013 Low Risk Contract current  Last refill 12/05/13 #30 with 1 refill Last OV 10/11/2013

## 2014-02-10 MED ORDER — LORAZEPAM 0.5 MG PO TABS: ORAL_TABLET | ORAL | Status: DC

## 2014-02-10 NOTE — Telephone Encounter (Signed)
done

## 2014-02-10 NOTE — Telephone Encounter (Signed)
[facility] [address] 

## 2014-04-01 ENCOUNTER — Other Ambulatory Visit: Payer: Self-pay | Admitting: Internal Medicine

## 2014-05-03 LAB — HM COLONOSCOPY

## 2014-09-22 ENCOUNTER — Other Ambulatory Visit: Payer: Self-pay

## 2014-12-10 DIAGNOSIS — Z872 Personal history of diseases of the skin and subcutaneous tissue: Secondary | ICD-10-CM | POA: Insufficient documentation

## 2015-06-19 ENCOUNTER — Encounter: Payer: Self-pay | Admitting: Gastroenterology

## 2016-04-04 DIAGNOSIS — D229 Melanocytic nevi, unspecified: Secondary | ICD-10-CM | POA: Diagnosis not present

## 2016-04-04 DIAGNOSIS — D239 Other benign neoplasm of skin, unspecified: Secondary | ICD-10-CM | POA: Diagnosis not present

## 2016-04-04 DIAGNOSIS — R1030 Lower abdominal pain, unspecified: Secondary | ICD-10-CM | POA: Diagnosis not present

## 2016-04-04 DIAGNOSIS — Z882 Allergy status to sulfonamides status: Secondary | ICD-10-CM | POA: Diagnosis not present

## 2016-04-04 DIAGNOSIS — Z872 Personal history of diseases of the skin and subcutaneous tissue: Secondary | ICD-10-CM | POA: Diagnosis not present

## 2016-04-04 DIAGNOSIS — R5383 Other fatigue: Secondary | ICD-10-CM | POA: Diagnosis not present

## 2016-04-04 DIAGNOSIS — E785 Hyperlipidemia, unspecified: Secondary | ICD-10-CM | POA: Diagnosis not present

## 2016-04-04 DIAGNOSIS — L821 Other seborrheic keratosis: Secondary | ICD-10-CM | POA: Diagnosis not present

## 2016-04-04 DIAGNOSIS — Z880 Allergy status to penicillin: Secondary | ICD-10-CM | POA: Diagnosis not present

## 2016-04-04 DIAGNOSIS — L814 Other melanin hyperpigmentation: Secondary | ICD-10-CM | POA: Diagnosis not present

## 2016-04-04 DIAGNOSIS — M545 Low back pain: Secondary | ICD-10-CM | POA: Diagnosis not present

## 2016-04-04 DIAGNOSIS — Z885 Allergy status to narcotic agent status: Secondary | ICD-10-CM | POA: Diagnosis not present

## 2016-04-08 DIAGNOSIS — R1013 Epigastric pain: Secondary | ICD-10-CM | POA: Diagnosis not present

## 2016-04-11 DIAGNOSIS — D259 Leiomyoma of uterus, unspecified: Secondary | ICD-10-CM | POA: Diagnosis not present

## 2016-05-02 DIAGNOSIS — R319 Hematuria, unspecified: Secondary | ICD-10-CM | POA: Diagnosis not present

## 2016-05-29 DIAGNOSIS — Z1231 Encounter for screening mammogram for malignant neoplasm of breast: Secondary | ICD-10-CM | POA: Diagnosis not present

## 2016-08-19 DIAGNOSIS — H524 Presbyopia: Secondary | ICD-10-CM | POA: Diagnosis not present

## 2016-10-21 DIAGNOSIS — R0789 Other chest pain: Secondary | ICD-10-CM | POA: Diagnosis not present

## 2016-10-21 DIAGNOSIS — F419 Anxiety disorder, unspecified: Secondary | ICD-10-CM | POA: Diagnosis not present

## 2016-10-28 DIAGNOSIS — R001 Bradycardia, unspecified: Secondary | ICD-10-CM | POA: Diagnosis not present

## 2017-04-16 DIAGNOSIS — E785 Hyperlipidemia, unspecified: Secondary | ICD-10-CM | POA: Diagnosis not present

## 2017-04-16 DIAGNOSIS — F411 Generalized anxiety disorder: Secondary | ICD-10-CM | POA: Diagnosis not present

## 2017-05-18 DIAGNOSIS — F419 Anxiety disorder, unspecified: Secondary | ICD-10-CM | POA: Diagnosis not present

## 2017-05-18 DIAGNOSIS — E785 Hyperlipidemia, unspecified: Secondary | ICD-10-CM | POA: Diagnosis not present

## 2017-06-01 DIAGNOSIS — Z1231 Encounter for screening mammogram for malignant neoplasm of breast: Secondary | ICD-10-CM | POA: Diagnosis not present

## 2017-07-13 DIAGNOSIS — D1801 Hemangioma of skin and subcutaneous tissue: Secondary | ICD-10-CM | POA: Diagnosis not present

## 2017-07-13 DIAGNOSIS — L821 Other seborrheic keratosis: Secondary | ICD-10-CM | POA: Diagnosis not present

## 2017-07-13 DIAGNOSIS — L814 Other melanin hyperpigmentation: Secondary | ICD-10-CM | POA: Diagnosis not present

## 2017-07-13 DIAGNOSIS — D239 Other benign neoplasm of skin, unspecified: Secondary | ICD-10-CM | POA: Diagnosis not present

## 2017-12-11 ENCOUNTER — Telehealth: Payer: Self-pay

## 2017-12-11 NOTE — Telephone Encounter (Signed)
Can you call and scheduled NP appt to re-establish for Pt please?

## 2017-12-11 NOTE — Telephone Encounter (Signed)
Appt scheduled 01/26/2018.

## 2017-12-11 NOTE — Telephone Encounter (Signed)
That is ok, thx  

## 2017-12-11 NOTE — Telephone Encounter (Signed)
Copied from [place]. Topic: Appointment Scheduling - Scheduling Inquiry for Clinic >> Dec 11, 2017 11:17 AM Aurelio Brash B wrote: Reason for CRM: Pt would like to schedule a CPE  with Dr Larose Kells,  she is a former pt of his that hasn't been seen since 2014 according to what I see in epic,  the pt states its not been that [place],  I told her I would have to set her up as a new pt  [facility]  but she wanted me to send a note to office to see if she can schedule a cpe instead of new pt apt

## 2017-12-11 NOTE — Telephone Encounter (Signed)
Called pt and LVM advising that the new pt request had been approved and to call and set up the appt

## 2017-12-11 NOTE — Telephone Encounter (Signed)
Pt requesting to re-establish- last OV 10/2013. Please advise.

## 2018-01-26 ENCOUNTER — Ambulatory Visit: Payer: Self-pay | Admitting: Internal Medicine

## 2018-01-26 ENCOUNTER — Encounter: Payer: Self-pay | Admitting: Internal Medicine

## 2018-01-26 ENCOUNTER — Ambulatory Visit (INDEPENDENT_AMBULATORY_CARE_PROVIDER_SITE_OTHER): Payer: BLUE CROSS/BLUE SHIELD | Admitting: Internal Medicine

## 2018-01-26 VITALS — BP 124/66 | HR 73 | Temp 98.3°F | Resp 14 | Ht 64.0 in | Wt 177.2 lb

## 2018-01-26 DIAGNOSIS — Z1231 Encounter for screening mammogram for malignant neoplasm of breast: Secondary | ICD-10-CM | POA: Diagnosis not present

## 2018-01-26 DIAGNOSIS — F329 Major depressive disorder, single episode, unspecified: Secondary | ICD-10-CM | POA: Diagnosis not present

## 2018-01-26 DIAGNOSIS — Z6830 Body mass index (BMI) 30.0-30.9, adult: Secondary | ICD-10-CM

## 2018-01-26 DIAGNOSIS — F419 Anxiety disorder, unspecified: Secondary | ICD-10-CM | POA: Diagnosis not present

## 2018-01-26 DIAGNOSIS — R739 Hyperglycemia, unspecified: Secondary | ICD-10-CM

## 2018-01-26 DIAGNOSIS — E785 Hyperlipidemia, unspecified: Secondary | ICD-10-CM

## 2018-01-26 DIAGNOSIS — Z01419 Encounter for gynecological examination (general) (routine) without abnormal findings: Secondary | ICD-10-CM | POA: Diagnosis not present

## 2018-01-26 DIAGNOSIS — E6609 Other obesity due to excess calories: Secondary | ICD-10-CM | POA: Diagnosis not present

## 2018-01-26 DIAGNOSIS — F32A Depression, unspecified: Secondary | ICD-10-CM

## 2018-01-26 LAB — HM PAP SMEAR

## 2018-01-26 LAB — HEMOGLOBIN A1C: HEMOGLOBIN A1C: 6 % (ref 4.6–6.5)

## 2018-01-26 MED ORDER — SERTRALINE HCL 50 MG PO TABS: 50.0000 mg | ORAL_TABLET | Freq: Every day | ORAL | 6 refills | Status: DC

## 2018-01-26 MED ORDER — ATORVASTATIN CALCIUM 10 MG PO TABS: 10.0000 mg | ORAL_TABLET | Freq: Every day | ORAL | 6 refills | Status: DC

## 2018-01-26 MED ORDER — LORAZEPAM 0.5 MG PO TABS: ORAL_TABLET | ORAL | 3 refills | Status: DC

## 2018-01-26 NOTE — Progress Notes (Signed)
Subjective:    Patient ID: Adrienne Burke, female    DOB: [birth year], 57 y.o.   MRN: [id number]  DOS:  01/26/2018 Type of visit - description : new pt Interval history: Since the last office visit ~ 2014, she has been getting her regular care at [facility] In general feeling well. Labs from care-everywhere reviewed. She is concerned about her weight, has not been able to lose weight consistently. Needs a refill on Ativan.   Review of Systems Denies chest pain or difficulty breathing. No nausea, vomiting, diarrhea. Anxiety depression: Controlled.  Past Medical History:  Diagnosis Date  . Actinic keratitis    hx of  . Anxiety and depression   . Atrophic vaginitis   . Colon polyp    Hyperplastic polyps 10/2004, Normal Cscope 2010   . ENDOMETRIOSIS   . Genital HSV   . HYPERLIPIDEMIA   . Insomnia   . Ovarian cyst    bilateral, s/p drainage    Past Surgical History:  Procedure Laterality Date  . ANKLE FRACTURE SURGERY    . APPENDECTOMY    . BREAST IMPLANT EXCHANGE     breast lift-implant 2009, repeated 08-2009    . EYE SURGERY     [place] for vision correction 02-2012  . TONSILLECTOMY AND ADENOIDECTOMY  1966  . TUBAL LIGATION    . uterine ablation      Social History   Socioeconomic History  . Marital status: Married    Spouse name: Not on file  . Number of children: 2  . Years of education: Not on file  . Highest education level: Not on file  Social Needs  . Financial resource strain: Not on file  . Food insecurity - worry: Not on file  . Food insecurity - inability: Not on file  . Transportation needs - medical: Not on file  . Transportation needs - non-medical: Not on file  Occupational History  . Occupation: pay roll-tax analyst  Tobacco Use  . Smoking status: Former Smoker    Packs/day: 0.50    Years: 15.00    Pack years: 7.50    Start date: 1980    Last attempt to quit: 1996    Years since quitting: 23.1  . Smokeless tobacco: Never  Used  . Tobacco comment: quit 1997  Substance and Sexual Activity  . Alcohol use: Yes    Comment: socially   . Drug use: No  . Sexual activity: Not on file  Other Topics Concern  . Not on file  Social History Narrative   G3 P2 ab 1     Married to [place] , 1 child alive , lost one              Family History  Problem Relation Age of Onset  . Cancer - Lung Mother        smoker  . Skin cancer Mother   . COPD Mother   . Cancer - Lung Father        smoker  . Heart disease Maternal Grandmother   . Heart disease Maternal Grandfather   . Heart disease Paternal Grandmother   . Heart disease Paternal Grandfather   . Colon cancer Paternal Grandfather 95  . Breast cancer Neg Hx      Allergies as of 01/26/2018      Reactions   Codeine Phosphate    REACTION: unspecified   Penicillins    REACTION: unspecified   Sulfonamide Derivatives    REACTION: unspecified  Medication List        Accurate as of 01/26/18 11:59 PM. Always use your most recent med list.          atorvastatin 10 MG tablet Commonly known as:  LIPITOR Take 1 tablet (10 mg total) by mouth daily.   LORazepam 0.5 MG tablet Commonly known as:  ATIVAN TAKE 1 TABLET BY MOUTH AT BEDTIME AS NEEDED FOR ANXIETY   multivitamin,tx-minerals tablet Take 1 tablet by mouth daily.   PROBIOTIC-10 Caps Take 1 tablet by mouth daily.   sertraline 50 MG tablet Commonly known as:  ZOLOFT Take 1 tablet (50 mg total) by mouth daily.          Objective:   Physical Exam BP 124/66 (BP Location: Left Arm, Patient Position: Sitting, Cuff Size: Small)   Pulse 73   Temp 98.3 F (36.8 C) (Oral)   Resp 14   Ht 5\' 4"  (1.626 m)   Wt 177 lb 4 oz (80.4 kg)   SpO2 98%   BMI 30.42 kg/m  General:   Well developed, well nourished . NAD.  HEENT:  Normocephalic . Face symmetric, atraumatic Lungs:  CTA B Normal respiratory effort, no intercostal retractions, no accessory muscle use. Heart: RRR,  no murmur.  No pretibial  edema bilaterally  Skin: Not pale. Not jaundice Neurologic:  alert & oriented X3.  Speech normal, gait appropriate for age and unassisted Psych--  Cognition and judgment appear intact.  Cooperative with normal attention span and concentration.  Behavior appropriate. No anxious or depressed appearing.      Assessment & Plan:   Assessment  Hyperlipidemia Anxiety, depression GYN: Endometriosis. s/p exploration , DUB, endometrial ablasion  Plan: Hyperlipidemia: Currently on Lipitor, labs 05/2017 showed a LDL of 116, CMP okay.  No change, refill Lipitor Anxiety depression: Controlled on sertraline, takes lorazepam 0.5 mg 1 p.o. nightly every night. Contract signed, RF  provided, UDS on RTC. hyperglycemia:  + FH DM, she check her CBGs some mornings, they are around 110.  We agreed to do a A1c. Obesity: We talk about diet and exercise, I provided information about the bariatric offices. RTC 3-4 months, fasting CPX

## 2018-01-26 NOTE — Progress Notes (Signed)
Pre visit review using our clinic review tool, if applicable. No additional management support is needed unless otherwise documented below in the visit note. 

## 2018-01-26 NOTE — Patient Instructions (Signed)
GO TO THE LAB : Get the blood work     GO TO THE FRONT DESK Schedule your next appointment for a  Physical exam in 3-4 months , fasting   Call for a refill of lorazepam in few months

## 2018-01-27 DIAGNOSIS — E669 Obesity, unspecified: Secondary | ICD-10-CM | POA: Insufficient documentation

## 2018-01-27 DIAGNOSIS — Z09 Encounter for follow-up examination after completed treatment for conditions other than malignant neoplasm: Secondary | ICD-10-CM | POA: Insufficient documentation

## 2018-01-27 NOTE — Assessment & Plan Note (Signed)
Hyperlipidemia: Currently on Lipitor, labs 05/2017 showed a LDL of 116, CMP okay.  No change, refill Lipitor Anxiety depression: Controlled on sertraline, takes lorazepam 0.5 mg 1 p.o. nightly every night. Contract signed, RF  provided, UDS on RTC. hyperglycemia:  + FH DM, she check her CBGs some mornings, they are around 110.  We agreed to do a A1c. Obesity: We talk about diet and exercise, I provided information about the bariatric offices. RTC 3-4 months, fasting CPX

## 2018-02-01 ENCOUNTER — Other Ambulatory Visit: Payer: Self-pay | Admitting: Obstetrics and Gynecology

## 2018-02-02 ENCOUNTER — Other Ambulatory Visit: Payer: Self-pay | Admitting: Obstetrics and Gynecology

## 2018-02-02 DIAGNOSIS — R928 Other abnormal and inconclusive findings on diagnostic imaging of breast: Secondary | ICD-10-CM

## 2018-02-03 ENCOUNTER — Other Ambulatory Visit: Payer: Self-pay | Admitting: Obstetrics and Gynecology

## 2018-02-03 ENCOUNTER — Ambulatory Visit
Admission: RE | Admit: 2018-02-03 | Discharge: 2018-02-03 | Disposition: A | Discharge status: Home / Self Care | Payer: BLUE CROSS/BLUE SHIELD | Source: Ambulatory Visit | Attending: Obstetrics and Gynecology | Admitting: Obstetrics and Gynecology

## 2018-02-03 DIAGNOSIS — R928 Other abnormal and inconclusive findings on diagnostic imaging of breast: Secondary | ICD-10-CM

## 2018-02-03 DIAGNOSIS — N6320 Unspecified lump in the left breast, unspecified quadrant: Secondary | ICD-10-CM

## 2018-02-03 DIAGNOSIS — N6489 Other specified disorders of breast: Secondary | ICD-10-CM | POA: Diagnosis not present

## 2018-02-03 LAB — HM MAMMOGRAPHY

## 2018-03-05 DIAGNOSIS — Z13820 Encounter for screening for osteoporosis: Secondary | ICD-10-CM | POA: Diagnosis not present

## 2018-03-05 LAB — HM DEXA SCAN

## 2018-03-26 DIAGNOSIS — L219 Seborrheic dermatitis, unspecified: Secondary | ICD-10-CM | POA: Diagnosis not present

## 2018-03-26 DIAGNOSIS — L821 Other seborrheic keratosis: Secondary | ICD-10-CM | POA: Diagnosis not present

## 2018-03-26 DIAGNOSIS — D229 Melanocytic nevi, unspecified: Secondary | ICD-10-CM | POA: Diagnosis not present

## 2018-06-02 ENCOUNTER — Encounter: Payer: BLUE CROSS/BLUE SHIELD | Admitting: Internal Medicine

## 2018-06-04 ENCOUNTER — Encounter: Payer: Self-pay | Admitting: Internal Medicine

## 2018-06-04 ENCOUNTER — Ambulatory Visit (INDEPENDENT_AMBULATORY_CARE_PROVIDER_SITE_OTHER): Payer: BLUE CROSS/BLUE SHIELD | Admitting: Internal Medicine

## 2018-06-04 VITALS — BP 118/64 | HR 64 | Temp 98.0°F | Resp 16 | Ht 64.0 in | Wt 166.2 lb

## 2018-06-04 DIAGNOSIS — Z0000 Encounter for general adult medical examination without abnormal findings: Secondary | ICD-10-CM

## 2018-06-04 DIAGNOSIS — F329 Major depressive disorder, single episode, unspecified: Secondary | ICD-10-CM | POA: Diagnosis not present

## 2018-06-04 DIAGNOSIS — F32A Depression, unspecified: Secondary | ICD-10-CM

## 2018-06-04 DIAGNOSIS — F419 Anxiety disorder, unspecified: Secondary | ICD-10-CM | POA: Diagnosis not present

## 2018-06-04 DIAGNOSIS — Z1159 Encounter for screening for other viral diseases: Secondary | ICD-10-CM | POA: Diagnosis not present

## 2018-06-04 DIAGNOSIS — R739 Hyperglycemia, unspecified: Secondary | ICD-10-CM

## 2018-06-04 DIAGNOSIS — Z79899 Other long term (current) drug therapy: Secondary | ICD-10-CM

## 2018-06-04 LAB — CBC WITH DIFFERENTIAL/PLATELET
BASOS PCT: 0.8 % (ref 0.0–3.0)
Basophils Absolute: 0 10*3/uL (ref 0.0–0.1)
EOS PCT: 0.9 % (ref 0.0–5.0)
Eosinophils Absolute: 0 10*3/uL (ref 0.0–0.7)
HCT: 38.6 % (ref 36.0–46.0)
HEMOGLOBIN: 12.9 g/dL (ref 12.0–15.0)
Lymphocytes Relative: 34.9 % (ref 12.0–46.0)
Lymphs Abs: 1.5 10*3/uL (ref 0.7–4.0)
MCHC: 33.5 g/dL (ref 30.0–36.0)
MCV: 92.8 fl (ref 78.0–100.0)
MONO ABS: 0.3 10*3/uL (ref 0.1–1.0)
Monocytes Relative: 6 % (ref 3.0–12.0)
NEUTROS ABS: 2.5 10*3/uL (ref 1.4–7.7)
Neutrophils Relative %: 57.4 % (ref 43.0–77.0)
PLATELETS: 234 10*3/uL (ref 150.0–400.0)
RBC: 4.15 Mil/uL (ref 3.87–5.11)
RDW: 12.9 % (ref 11.5–15.5)
WBC: 4.3 10*3/uL (ref 4.0–10.5)

## 2018-06-04 LAB — COMPREHENSIVE METABOLIC PANEL
ALK PHOS: 65 U/L (ref 39–117)
ALT: 4 U/L (ref 0–35)
AST: 14 U/L (ref 0–37)
Albumin: 4.5 g/dL (ref 3.5–5.2)
BUN: 14 mg/dL (ref 6–23)
CHLORIDE: 103 meq/L (ref 96–112)
CO2: 28 mEq/L (ref 19–32)
Calcium: 9.7 mg/dL (ref 8.4–10.5)
Creatinine, Ser: 0.84 mg/dL (ref 0.40–1.20)
GFR: 74.82 mL/min (ref >60.00)
Glucose, Bld: 96 mg/dL (ref 70–99)
POTASSIUM: 4.4 meq/L (ref 3.5–5.1)
SODIUM: 139 meq/L (ref 135–145)
Total Bilirubin: 0.5 mg/dL (ref 0.2–1.2)
Total Protein: 6.9 g/dL (ref 6.0–8.3)

## 2018-06-04 LAB — LIPID PANEL
Cholesterol: 231 mg/dL — ABNORMAL HIGH (ref 0–200)
HDL: 63.4 mg/dL (ref >39.00)
LDL Cholesterol: 152 mg/dL — ABNORMAL HIGH (ref 0–99)
NonHDL: 167.54
TRIGLYCERIDES: 76 mg/dL (ref 0.0–149.0)
Total CHOL/HDL Ratio: 4
VLDL: 15.2 mg/dL (ref 0.0–40.0)

## 2018-06-04 LAB — TSH: TSH: 2.63 u[IU]/mL (ref 0.35–4.50)

## 2018-06-04 LAB — HEMOGLOBIN A1C: Hgb A1c MFr Bld: 6 % (ref 4.6–6.5)

## 2018-06-04 NOTE — Patient Instructions (Signed)
GO TO THE LAB : Get the blood work     GO TO THE FRONT DESK Schedule your next appointment for a physical exam in 1 year   Think about the "low-dose CT chest" for lung cancer screening

## 2018-06-04 NOTE — Progress Notes (Signed)
Subjective:    Patient ID: Adrienne Burke, female    DOB: December 17, 1960, 57 y.o.   MRN: [id number]  DOS:  06/04/2018 Type of visit - description : cpx Interval history: Feeling great.  Counting calories, increase exercise, losing weight.  Review of Systems Has a callus that is dry, thick and cracked   located on the left foot  Other than above, a 14 point review of systems is negative    Past Medical History:  Diagnosis Date  . Actinic keratitis    hx of  . Anxiety and depression   . Atrophic vaginitis   . Colon polyp    Hyperplastic polyps 10/2004, Normal Cscope 2010   . ENDOMETRIOSIS   . Genital HSV   . HYPERLIPIDEMIA   . Insomnia   . Ovarian cyst    bilateral, s/p drainage    Past Surgical History:  Procedure Laterality Date  . ANKLE FRACTURE SURGERY    . APPENDECTOMY    . BREAST IMPLANT EXCHANGE     breast lift-implant 2009, repeated 08-2009    . EYE SURGERY     [place] for vision correction 02-2012  . TONSILLECTOMY AND ADENOIDECTOMY  1966  . TUBAL LIGATION    . uterine ablation      Social History   Socioeconomic History  . Marital status: Married    Spouse name: Not on file  . Number of children: 2  . Years of education: Not on file  . Highest education level: Not on file  Occupational History  . Occupation: pay roll-tax analyst  Social Needs  . Financial resource strain: Not on file  . Food insecurity:    Worry: Not on file    Inability: Not on file  . Transportation needs:    Medical: Not on file    Non-medical: Not on file  Tobacco Use  . Smoking status: Former Smoker    Packs/day: 0.50    Years: 15.00    Pack years: 7.50    Start date: 1980    Last attempt to quit: 1996    Years since quitting: 23.5  . Smokeless tobacco: Never Used  . Tobacco comment: quit 1997  Substance and Sexual Activity  . Alcohol use: Yes    Comment: socially   . Drug use: No  . Sexual activity: Not on file  Lifestyle  . Physical activity:    Days per week: Not on  file    Minutes per session: Not on file  . Stress: Not on file  Relationships  . Social connections:    Talks on phone: Not on file    Gets together: Not on file    Attends religious service: Not on file    Active member of club or organization: Not on file    Attends meetings of clubs or organizations: Not on file    Relationship status: Not on file  . Intimate partner violence:    Fear of current or ex partner: Not on file    Emotionally abused: Not on file    Physically abused: Not on file    Forced sexual activity: Not on file  Other Topics Concern  . Not on file  Social History Narrative   G3 P2 ab 1     Married to [place] , 1 child alive , lost one              Family History  Problem Relation Age of Onset  . Cancer - Lung Mother  smoker  . Skin cancer Mother   . COPD Mother   . Cancer - Lung Father        smoker  . Heart disease Maternal Grandmother   . Heart disease Maternal Grandfather   . Heart disease Paternal Grandmother   . Heart disease Paternal Grandfather   . Colon cancer Paternal Grandfather 14  . Breast cancer Neg Hx      Allergies as of 06/04/2018      Reactions   Codeine Phosphate    REACTION: unspecified   Penicillins    REACTION: unspecified   Sulfonamide Derivatives    REACTION: unspecified      Medication List        Accurate as of 06/04/18 11:59 PM. Always use your most recent med list.          atorvastatin 10 MG tablet Commonly known as:  LIPITOR Take 1 tablet (10 mg total) by mouth daily.   LORazepam 0.5 MG tablet Commonly known as:  ATIVAN TAKE 1 TABLET BY MOUTH AT BEDTIME AS NEEDED FOR ANXIETY   multivitamin,tx-minerals tablet Take 1 tablet by mouth daily.   PROBIOTIC-10 Caps Take 1 tablet by mouth daily.   sertraline 50 MG tablet Commonly known as:  ZOLOFT Take 1 tablet (50 mg total) by mouth daily.          Objective:   Physical Exam BP 118/64 (BP Location: Left Arm, Patient Position: Sitting, Cuff  Size: Small)   Pulse 64   Temp 98 F (36.7 C) (Oral)   Resp 16   Ht 5\' 4"  (1.626 m)   Wt 166 lb 4 oz (75.4 kg)   SpO2 98%   BMI 28.54 kg/m  General: Well developed, NAD, see BMI.  Neck: No  thyromegaly  HEENT:  Normocephalic . Face symmetric, atraumatic Lungs:  CTA B Normal respiratory effort, no intercostal retractions, no accessory muscle use. Heart: RRR,  no murmur.  No pretibial edema bilaterally  Abdomen:  Not distended, soft, non-tender. No rebound or rigidity.   Skin: + Callus on the left foot, base of the great toe. Neurologic:  alert & oriented X3.  Speech normal, gait appropriate for age and unassisted Strength symmetric and appropriate for age.  Psych: Cognition and judgment appear intact.  Cooperative with normal attention span and concentration.  Behavior appropriate. No anxious or depressed appearing.     Assessment & Plan:    Assessment  Glycemia: A1c 6.0 01-2018 Hyperlipidemia Anxiety, depression GYN: Endometriosis. s/p exploration , DUB, endometrial ablasion H/o actinic keratosis, sees derm q year as of 05/2018  Plan: Hyperglycemia: Doing great with lifestyle, rechecking labs Hyper lipidemia: On Lipitor, checking labs Anxiety depression: Controlled, UDS today, continue sertraline, lorazepam RF as needed Callous: recommend to see podiatry RTC 1 year

## 2018-06-04 NOTE — Progress Notes (Signed)
Pre visit review using our clinic review tool, if applicable. No additional management support is needed unless otherwise documented below in the visit note. 

## 2018-06-04 NOTE — Assessment & Plan Note (Addendum)
--  Td 2014  --Sees  Gyn:  pap, breast exam and hemocults. MMG 01/2018 (-) --DEXA done 2018 or 2019 per pt, no reports, asked pt to get a copy  --Cscope 11-05 + polyps, colonoscopy again 09/2009, normal.  Next 2020 --Doing great with diet and exercise, counting calories, has lost several pounds. --Family history of lung cancer, patient is a former smoker.  Recommend to think about lung cancer screening CT --Labs: CMP, FLP, CBC, A1c, TSH, vitamin D

## 2018-06-05 NOTE — Assessment & Plan Note (Signed)
Hyperglycemia: Doing great with lifestyle, rechecking labs Hyper lipidemia: On Lipitor, checking labs Anxiety depression: Controlled, UDS today, continue sertraline, lorazepam RF as needed Callous: recommend to see podiatry RTC 1 year

## 2018-06-07 LAB — PAIN MGMT, PROFILE 8 W/CONF, U
6 Acetylmorphine: NEGATIVE ng/mL (ref <10)
AMPHETAMINES: NEGATIVE ng/mL (ref <500)
Alcohol Metabolites: NEGATIVE ng/mL (ref <500)
Alphahydroxyalprazolam: NEGATIVE ng/mL (ref <25)
Alphahydroxymidazolam: NEGATIVE ng/mL (ref <50)
Alphahydroxytriazolam: NEGATIVE ng/mL (ref <50)
Aminoclonazepam: NEGATIVE ng/mL (ref <25)
BENZODIAZEPINES: POSITIVE ng/mL — AB (ref <100)
Buprenorphine, Urine: NEGATIVE ng/mL (ref <5)
COCAINE METABOLITE: NEGATIVE ng/mL (ref <150)
CREATININE: 155.5 mg/dL
Hydroxyethylflurazepam: NEGATIVE ng/mL (ref <50)
LORAZEPAM: 405 ng/mL — AB (ref <50)
MDMA: NEGATIVE ng/mL (ref <500)
Marijuana Metabolite: NEGATIVE ng/mL (ref <20)
NORDIAZEPAM: NEGATIVE ng/mL (ref <50)
OXIDANT: NEGATIVE ug/mL (ref <200)
Opiates: NEGATIVE ng/mL (ref <100)
Oxazepam: NEGATIVE ng/mL (ref <50)
Oxycodone: NEGATIVE ng/mL (ref <100)
PH: 6.43 (ref 4.5–9.0)
Temazepam: NEGATIVE ng/mL (ref <50)

## 2018-06-07 LAB — VITAMIN D 1,25 DIHYDROXY
VITAMIN D3 1, 25 (OH): 52 pg/mL
Vitamin D 1, 25 (OH)2 Total: 52 pg/mL (ref 18–72)

## 2018-06-07 LAB — HEPATITIS C ANTIBODY
Hepatitis C Ab: NONREACTIVE
SIGNAL TO CUT-OFF: 0.02 (ref <1.00)

## 2018-08-05 ENCOUNTER — Other Ambulatory Visit: Payer: Self-pay | Admitting: Internal Medicine

## 2018-08-11 ENCOUNTER — Other Ambulatory Visit: Payer: BLUE CROSS/BLUE SHIELD

## 2018-10-24 DIAGNOSIS — J019 Acute sinusitis, unspecified: Secondary | ICD-10-CM | POA: Diagnosis not present

## 2018-12-03 ENCOUNTER — Other Ambulatory Visit: Payer: Self-pay | Admitting: Internal Medicine

## 2018-12-03 NOTE — Telephone Encounter (Signed)
Refill Request: Lorazepam   Last RX:08/05/18 Last OV:06/04/18 Next WP:[id number] scheduled  UDS:06/04/18 CSC:01/26/18 CSR:

## 2018-12-03 NOTE — Telephone Encounter (Signed)
sent 

## 2018-12-03 NOTE — Telephone Encounter (Signed)
Prescription apparently did not go through, I call and give a verbal order to the pharmacist.

## 2018-12-15 ENCOUNTER — Other Ambulatory Visit: Payer: Self-pay | Admitting: Internal Medicine

## 2019-01-23 ENCOUNTER — Other Ambulatory Visit: Payer: Self-pay | Admitting: Internal Medicine

## 2019-02-03 DIAGNOSIS — Z01419 Encounter for gynecological examination (general) (routine) without abnormal findings: Secondary | ICD-10-CM | POA: Diagnosis not present

## 2019-02-03 DIAGNOSIS — Z1231 Encounter for screening mammogram for malignant neoplasm of breast: Secondary | ICD-10-CM | POA: Diagnosis not present

## 2019-02-03 DIAGNOSIS — Z6830 Body mass index (BMI) 30.0-30.9, adult: Secondary | ICD-10-CM | POA: Diagnosis not present

## 2019-02-03 LAB — HM MAMMOGRAPHY

## 2019-03-04 ENCOUNTER — Other Ambulatory Visit: Payer: Self-pay

## 2019-03-04 ENCOUNTER — Encounter: Payer: Self-pay | Admitting: Internal Medicine

## 2019-03-04 ENCOUNTER — Telehealth: Payer: Self-pay

## 2019-03-04 ENCOUNTER — Ambulatory Visit (INDEPENDENT_AMBULATORY_CARE_PROVIDER_SITE_OTHER): Payer: BLUE CROSS/BLUE SHIELD | Admitting: Internal Medicine

## 2019-03-04 DIAGNOSIS — J40 Bronchitis, not specified as acute or chronic: Secondary | ICD-10-CM

## 2019-03-04 MED ORDER — DOXYCYCLINE HYCLATE 100 MG PO TABS: 100.0000 mg | ORAL_TABLET | Freq: Two times a day (BID) | ORAL | 0 refills | Status: DC

## 2019-03-04 NOTE — Telephone Encounter (Signed)
Okay for PEC to inform how to download webex (if using tablet or smartphone), and okay to get email to send link.

## 2019-03-04 NOTE — Telephone Encounter (Signed)
Spoke w/ Pt- she will be using laptop with webcamera. Link sent to email provided.

## 2019-03-04 NOTE — Telephone Encounter (Signed)
Appt scheduled 03/04/2019 at 10am by PEC. LMOM informing Pt that we are NOT seeing patients in clinic. Informed to call back we can set up virtual visit.

## 2019-03-04 NOTE — Progress Notes (Signed)
Subjective:    Patient ID: Adrienne Burke, female    DOB: November 22, 1961, 58 y.o.   MRN: [id number]  DOS:  03/04/2019 Type of visit - description: Video visit The patient thinks she has bronchitis. Her main symptom is anterior, bilateral, chest tightness. Denies fever, chills, cough. She however thinks she has bronchitis because in the past she felt the same way, was DX with bronchitis and antibiotics helped. The tightness is steady, not worse with exertion, she is actually working from home. No exertional eactivities   Review of Systems No fever chills Denies shortness of breath or DOE No recent airplane trip or prolonged car trip No chest tightness with deep breathing. No GERD symptoms   Past Medical History:  Diagnosis Date  . Actinic keratitis    hx of  . Anxiety and depression   . Atrophic vaginitis   . Colon polyp    Hyperplastic polyps 10/2004, Normal Cscope 2010   . ENDOMETRIOSIS   . Genital HSV   . HYPERLIPIDEMIA   . Insomnia   . Ovarian cyst    bilateral, s/p drainage    Past Surgical History:  Procedure Laterality Date  . ANKLE FRACTURE SURGERY    . APPENDECTOMY    . BREAST IMPLANT EXCHANGE     breast lift-implant 2009, repeated 08-2009    . EYE SURGERY     [place] for vision correction 02-2012  . TONSILLECTOMY AND ADENOIDECTOMY  1966  . TUBAL LIGATION    . uterine ablation      Social History   Socioeconomic History  . Marital status: Married    Spouse name: Not on file  . Number of children: 2  . Years of education: Not on file  . Highest education level: Not on file  Occupational History  . Occupation: pay roll-tax analyst  Social Needs  . Financial resource strain: Not on file  . Food insecurity:    Worry: Not on file    Inability: Not on file  . Transportation needs:    Medical: Not on file    Non-medical: Not on file  Tobacco Use  . Smoking status: Former Smoker    Packs/day: 0.50    Years: 15.00    Pack years: 7.50    Start date: 1980   Last attempt to quit: 1996    Years since quitting: 24.2  . Smokeless tobacco: Never Used  . Tobacco comment: quit 1997  Substance and Sexual Activity  . Alcohol use: Yes    Comment: socially   . Drug use: No  . Sexual activity: Not on file  Lifestyle  . Physical activity:    Days per week: Not on file    Minutes per session: Not on file  . Stress: Not on file  Relationships  . Social connections:    Talks on phone: Not on file    Gets together: Not on file    Attends religious service: Not on file    Active member of club or organization: Not on file    Attends meetings of clubs or organizations: Not on file    Relationship status: Not on file  . Intimate partner violence:    Fear of current or ex partner: Not on file    Emotionally abused: Not on file    Physically abused: Not on file    Forced sexual activity: Not on file  Other Topics Concern  . Not on file  Social History Narrative   G3 P2 ab 1  Married to [place] , 1 child alive , lost one               Allergies as of 03/04/2019      Reactions   Codeine Phosphate    REACTION: unspecified   Penicillins    REACTION: unspecified   Sulfonamide Derivatives    REACTION: unspecified      Medication List       Accurate as of March 04, 2019 10:00 AM. Always use your most recent med list.        atorvastatin 10 MG tablet Commonly known as:  LIPITOR Take 1 tablet (10 mg total) by mouth daily.   LORazepam 0.5 MG tablet Commonly known as:  ATIVAN TAKE 1 TAB BY MOUTH AT BEDTIME AS NEEDED FOR ANXIETY   multivitamin,tx-minerals tablet Take 1 tablet by mouth daily.   Probiotic-10 Caps Take 1 tablet by mouth daily.   sertraline 50 MG tablet Commonly known as:  ZOLOFT Take 1 tablet (50 mg total) by mouth daily.           Objective:   Physical Exam There were no vitals taken for this visit. 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    Assessment     Assessment  Glycemia: A1c 6.0  01-2018 Hyperlipidemia Anxiety, depression GYN: Endometriosis. s/p exploration , DUB, endometrial ablasion H/o actinic keratosis, sees derm q year as of 05/2018  Plan: Video conference  Bronchitis?  Not much to support the diagnosis however in the patients experience the "chest tightness" is related to bronchitis.  She requests antibiotics. The tightness does not seem to be cardiac, she is in no distress and having no DOE. We agreed on good hydration, take Mucinex for few days and only take doxycycline (which I am sending to the pharmacy) if not improving in the next 5 days. She is aware of the limitations of a video visit. Anxiety, depression: She is doing well, we had a conversation about COVID-19.  She is already taking good precautions A summary of my recommendations will be sent via message

## 2019-03-06 DIAGNOSIS — J0100 Acute maxillary sinusitis, unspecified: Secondary | ICD-10-CM | POA: Diagnosis not present

## 2019-03-07 NOTE — Assessment & Plan Note (Signed)
Video conference  Bronchitis?  Not much to support the diagnosis however in the patients experience the "chest tightness" is related to bronchitis.  She requests antibiotics. The tightness does not seem to be cardiac, she is in no distress and having no DOE. We agreed on good hydration, take Mucinex for few days and only take doxycycline (which I am sending to the pharmacy) if not improving in the next 5 days. She is aware of the limitations of a video visit. Anxiety, depression: She is doing well, we had a conversation about COVID-19.  She is already taking good precautions A summary of my recommendations will be sent via message

## 2019-03-08 ENCOUNTER — Other Ambulatory Visit: Payer: Self-pay

## 2019-03-08 ENCOUNTER — Emergency Department (HOSPITAL_BASED_OUTPATIENT_CLINIC_OR_DEPARTMENT_OTHER): Payer: BLUE CROSS/BLUE SHIELD

## 2019-03-08 ENCOUNTER — Ambulatory Visit: Payer: Self-pay

## 2019-03-08 ENCOUNTER — Encounter (HOSPITAL_BASED_OUTPATIENT_CLINIC_OR_DEPARTMENT_OTHER): Payer: Self-pay

## 2019-03-08 ENCOUNTER — Emergency Department (HOSPITAL_BASED_OUTPATIENT_CLINIC_OR_DEPARTMENT_OTHER)
Admission: EM | Admit: 2019-03-08 | Discharge: 2019-03-08 | Disposition: A | Discharge status: Home / Self Care | Payer: BLUE CROSS/BLUE SHIELD | Attending: Emergency Medicine | Admitting: Emergency Medicine

## 2019-03-08 DIAGNOSIS — R059 Cough, unspecified: Secondary | ICD-10-CM

## 2019-03-08 DIAGNOSIS — Z87891 Personal history of nicotine dependence: Secondary | ICD-10-CM | POA: Insufficient documentation

## 2019-03-08 DIAGNOSIS — R0989 Other specified symptoms and signs involving the circulatory and respiratory systems: Secondary | ICD-10-CM | POA: Diagnosis not present

## 2019-03-08 DIAGNOSIS — R0789 Other chest pain: Secondary | ICD-10-CM | POA: Diagnosis not present

## 2019-03-08 DIAGNOSIS — J069 Acute upper respiratory infection, unspecified: Secondary | ICD-10-CM | POA: Diagnosis not present

## 2019-03-08 DIAGNOSIS — R05 Cough: Secondary | ICD-10-CM

## 2019-03-08 DIAGNOSIS — B9789 Other viral agents as the cause of diseases classified elsewhere: Secondary | ICD-10-CM

## 2019-03-08 DIAGNOSIS — Z79899 Other long term (current) drug therapy: Secondary | ICD-10-CM | POA: Diagnosis not present

## 2019-03-08 DIAGNOSIS — R079 Chest pain, unspecified: Secondary | ICD-10-CM

## 2019-03-08 LAB — CBC WITH DIFFERENTIAL/PLATELET
Abs Immature Granulocytes: 0.01 10*3/uL (ref 0.00–0.07)
Basophils Absolute: 0.1 10*3/uL (ref 0.0–0.1)
Basophils Relative: 1 %
EOS ABS: 0.2 10*3/uL (ref 0.0–0.5)
Eosinophils Relative: 3 %
HCT: 38 % (ref 36.0–46.0)
Hemoglobin: 12.2 g/dL (ref 12.0–15.0)
Immature Granulocytes: 0 %
Lymphocytes Relative: 27 %
Lymphs Abs: 1.7 10*3/uL (ref 0.7–4.0)
MCH: 30.7 pg (ref 26.0–34.0)
MCHC: 32.1 g/dL (ref 30.0–36.0)
MCV: 95.5 fL (ref 80.0–100.0)
Monocytes Absolute: 0.3 10*3/uL (ref 0.1–1.0)
Monocytes Relative: 6 %
Neutro Abs: 3.8 10*3/uL (ref 1.7–7.7)
Neutrophils Relative %: 63 %
Platelets: 254 10*3/uL (ref 150–400)
RBC: 3.98 MIL/uL (ref 3.87–5.11)
RDW: 12.8 % (ref 11.5–15.5)
WBC: 6.1 10*3/uL (ref 4.0–10.5)
nRBC: 0 % (ref 0.0–0.2)

## 2019-03-08 LAB — BASIC METABOLIC PANEL
ANION GAP: 7 (ref 5–15)
BUN: 14 mg/dL (ref 6–20)
CO2: 25 mmol/L (ref 22–32)
Calcium: 9.1 mg/dL (ref 8.9–10.3)
Chloride: 101 mmol/L (ref 98–111)
Creatinine, Ser: 0.67 mg/dL (ref 0.44–1.00)
GFR calc Af Amer: >60 mL/min (ref >60)
Glucose, Bld: 120 mg/dL — ABNORMAL HIGH (ref 70–99)
Potassium: 3.8 mmol/L (ref 3.5–5.1)
Sodium: 133 mmol/L — ABNORMAL LOW (ref 135–145)

## 2019-03-08 LAB — TROPONIN I: Troponin I: <0.03 ng/mL (ref <0.03)

## 2019-03-08 MED ORDER — ALBUTEROL SULFATE HFA 108 (90 BASE) MCG/ACT IN AERS
2.0000 | INHALATION_SPRAY | Freq: Once | RESPIRATORY_TRACT | Status: AC
  Administered 2019-03-08: 2 via RESPIRATORY_TRACT
  Filled 2019-03-08: qty 6.7

## 2019-03-08 NOTE — ED Triage Notes (Signed)
Pt c/o flu like sx x 6 days-virtual visit with PCP-rx doxycycline called in but not to take x 4 days-seen at [facility] and started on zpack for sinus infection-pt with cont'd flu like sx and chest pain-was advised by PCP to come to ED-pt NAD-steady gait

## 2019-03-08 NOTE — ED Notes (Signed)
ED Provider at bedside. 

## 2019-03-08 NOTE — Discharge Instructions (Addendum)
At this point it appears you have a viral illness.  Given the current pandemic this could be COVID-19.  You are being asked to self quarantine for the next 7 days and you must be symptom-free for at least 3 days.  If at any point you get worse, especially shortness of breath, return to the ER for evaluation.  If you develop recurrent, continued, or worsening chest pain, shortness of breath, fever, vomiting, abdominal or back pain, or any other new/concerning symptoms then return to the ER for evaluation.      Person Under Monitoring Name: Adrienne Seckel. Mccants  Location: [place] [address]   Infection Prevention Recommendations for Individuals Confirmed to have, or Being Evaluated for, 2019 Novel Coronavirus (COVID-19) Infection Who Receive Care at Home  Individuals who are confirmed to have, or are being evaluated for, COVID-19 should follow the prevention steps below until a healthcare provider or local or state health department says they can return to normal activities.  Stay home except to get medical care You should restrict activities outside your home, except for getting medical care. Do not go to work, school, or public areas, and do not use public transportation or taxis.  Call ahead before visiting your doctor Before your medical appointment, call the healthcare provider and tell them that you have, or are being evaluated for, COVID-19 infection. This will help the healthcare providers office take steps to keep other people from getting infected. Ask your healthcare provider to call the local or state health department.  Monitor your symptoms Seek prompt medical attention if your illness is worsening (e.g., difficulty breathing). Before going to your medical appointment, call the healthcare provider and tell them that you have, or are being evaluated for, COVID-19 infection. Ask your healthcare provider to call the local or state health department.  Wear a  facemask You should wear a facemask that covers your nose and mouth when you are in the same room with other people and when you visit a healthcare provider. People who live with or visit you should also wear a facemask while they are in the same room with you.  Separate yourself from other people in your home As much as possible, you should stay in a different room from other people in your home. Also, you should use a separate bathroom, if available.  Avoid sharing household items You should not share dishes, drinking glasses, cups, eating utensils, towels, bedding, or other items with other people in your home. After using these items, you should wash them thoroughly with soap and water.  Cover your coughs and sneezes Cover your mouth and nose with a tissue when you cough or sneeze, or you can cough or sneeze into your sleeve. Throw used tissues in a lined trash can, and immediately wash your hands with soap and water for at least 20 seconds or use an alcohol-based hand rub.  Wash your [facility] your hands often and thoroughly with soap and water for at least 20 seconds. You can use an alcohol-based hand sanitizer if soap and water are not available and if your hands are not visibly dirty. Avoid touching your eyes, nose, and mouth with unwashed hands.   Prevention Steps for Caregivers and Household Members of Individuals Confirmed to have, or Being Evaluated for, COVID-19 Infection Being Cared for in the Home  If you live with, or provide care at home for, a person confirmed to have, or being evaluated for, COVID-19 infection  please follow these guidelines to prevent infection:  Follow healthcare providers instructions Make sure that you understand and can help the patient follow any healthcare provider instructions for all care.  Provide for the patients basic needs You should help the patient with basic needs in the home and provide support for getting groceries,  prescriptions, and other personal needs.  Monitor the patients symptoms If they are getting sicker, call his or her medical provider and tell them that the patient has, or is being evaluated for, COVID-19 infection. This will help the healthcare providers office take steps to keep other people from getting infected. Ask the healthcare provider to call the local or state health department.  Limit the number of people who have contact with the patient If possible, have only one caregiver for the patient. Other household members should stay in another home or place of residence. If this is not possible, they should stay in another room, or be separated from the patient as much as possible. Use a separate bathroom, if available. Restrict visitors who do not have an essential need to be in the home.  Keep older adults, very young children, and other sick people away from the patient Keep older adults, very young children, and those who have compromised immune systems or chronic health conditions away from the patient. This includes people with chronic heart, lung, or kidney conditions, diabetes, and cancer.  Ensure good ventilation Make sure that shared spaces in the home have good air flow, such as from an air conditioner or an opened window, weather permitting.  Wash your hands often Wash your hands often and thoroughly with soap and water for at least 20 seconds. You can use an alcohol based hand sanitizer if soap and water are not available and if your hands are not visibly dirty. Avoid touching your eyes, nose, and mouth with unwashed hands. Use disposable paper towels to dry your hands. If not available, use dedicated cloth towels and replace them when they become wet.  Wear a facemask and gloves Wear a disposable facemask at all times in the room and gloves when you touch or have contact with the patients blood, body fluids, and/or secretions or excretions, such as sweat, saliva,  sputum, nasal mucus, vomit, urine, or feces.  Ensure the mask fits over your nose and mouth tightly, and do not touch it during use. Throw out disposable facemasks and gloves after using them. Do not reuse. Wash your hands immediately after removing your facemask and gloves. If your personal clothing becomes contaminated, carefully remove clothing and launder. Wash your hands after handling contaminated clothing. Place all used disposable facemasks, gloves, and other waste in a lined container before disposing them with other household waste. Remove gloves and wash your hands immediately after handling these items.  Do not share dishes, glasses, or other household items with the patient Avoid sharing household items. You should not share dishes, drinking glasses, cups, eating utensils, towels, bedding, or other items with a patient who is confirmed to have, or being evaluated for, COVID-19 infection. After the person uses these items, you should wash them thoroughly with soap and water.  Wash laundry thoroughly Immediately remove and wash clothes or bedding that have blood, body fluids, and/or secretions or excretions, such as sweat, saliva, sputum, nasal mucus, vomit, urine, or feces, on them. Wear gloves when handling laundry from the patient. Read and follow directions on labels of laundry or clothing items and detergent. In general, wash and dry with  the warmest temperatures recommended on the label.  Clean all areas the individual has used often Clean all touchable surfaces, such as counters, tabletops, doorknobs, bathroom fixtures, toilets, phones, keyboards, tablets, and bedside tables, every day. Also, clean any surfaces that may have blood, body fluids, and/or secretions or excretions on them. Wear gloves when cleaning surfaces the patient has come in contact with. Use a diluted bleach solution (e.g., dilute bleach with 1 part bleach and 10 parts water) or a household disinfectant with a  label that says EPA-registered for coronaviruses. To make a bleach solution at home, add 1 tablespoon of bleach to 1 quart (4 cups) of water. For a larger supply, add  cup of bleach to 1 gallon (16 cups) of water. Read labels of cleaning products and follow recommendations provided on product labels. Labels contain instructions for safe and effective use of the cleaning product including precautions you should take when applying the product, such as wearing gloves or eye protection and making sure you have good ventilation during use of the product. Remove gloves and wash hands immediately after cleaning.  Monitor yourself for signs and symptoms of illness Caregivers and household members are considered close contacts, should monitor their health, and will be asked to limit movement outside of the home to the extent possible. Follow the monitoring steps for close contacts listed on the symptom monitoring form.   ? If you have additional questions, contact your local health department or call the epidemiologist on call at [phone] (available 24/7). ? This guidance is subject to change. For the most up-to-date guidance from [facility], please refer to their website: YouBlogs.pl

## 2019-03-08 NOTE — ED Notes (Signed)
Pt assisted to bedside commode. No increased SOB.

## 2019-03-08 NOTE — ED Provider Notes (Signed)
[facility] EMERGENCY DEPARTMENT Provider Note   CSN: [id number] Arrival date & time: 03/08/19  1342    History   Chief Complaint Chief Complaint  Patient presents with  . Cough    HPI Adrienne Burke is a 58 y.o. female.     HPI  58 year old female presents with shortness of breath and chest pain.  She states the symptoms started on 3/26.  Originally started with some sore throat and congestion.  She has had a little bit of a cough but not much.  The shortness of breath is pretty much constant.  The chest pressure is in the middle of her chest and also is mostly constant.  She has had low-grade fevers and today it was 100.  No specific known contacts with a COVID-19+ patient.  No leg swelling, leg pain or recent travel.  The pain is worse with inspiration.  She saw urgent care and was placed on antibiotics, with no relief. No vomiting.  Past Medical History:  Diagnosis Date  . Actinic keratitis    hx of  . Anxiety and depression   . Atrophic vaginitis   . Colon polyp    Hyperplastic polyps 10/2004, Normal Cscope 2010   . ENDOMETRIOSIS   . Genital HSV   . HYPERLIPIDEMIA   . Insomnia   . Ovarian cyst    bilateral, s/p drainage    Patient Active Problem List   Diagnosis Date Noted  . PCP NOTES >>>>>>>>>>>>>> 01/27/2018  . Obesity 01/27/2018  . History of actinic keratosis 12/10/2014  . Depression 12/29/2013  . Annual physical exam 05/31/2012  . Hyperlipidemia 01/20/2008  . Anxiety and depression 01/20/2008  . ENDOMETRIOSIS 01/20/2008    Past Surgical History:  Procedure Laterality Date  . ANKLE FRACTURE SURGERY    . APPENDECTOMY    . BREAST IMPLANT EXCHANGE     breast lift-implant 2009, repeated 08-2009    . EYE SURGERY     [place] for vision correction 02-2012  . TONSILLECTOMY AND ADENOIDECTOMY  1966  . TUBAL LIGATION    . uterine ablation       OB History   No obstetric history on file.      Home Medications    Prior to Admission medications    Medication Sig Start Date End Date Taking? Authorizing Provider  atorvastatin (LIPITOR) 10 MG tablet Take 1 tablet (10 mg total) by mouth daily. 01/24/19   Colon Branch, MD  doxycycline (VIBRA-TABS) 100 MG tablet Take 1 tablet (100 mg total) by mouth 2 (two) times daily. 03/04/19   Colon Branch, MD  LORazepam (ATIVAN) 0.5 MG tablet TAKE 1 TAB BY MOUTH AT BEDTIME AS NEEDED FOR ANXIETY 12/03/18   Colon Branch, MD  Multiple Vitamins-Minerals (MULTIVITAMIN,TX-MINERALS) tablet Take 1 tablet by mouth daily.      [provider]  Probiotic Product (PROBIOTIC-10) CAPS Take 1 tablet by mouth daily.    [provider]  sertraline (ZOLOFT) 50 MG tablet Take 1 tablet (50 mg total) by mouth daily. 12/15/18   Colon Branch, MD    Family History Family History  Problem Relation Age of Onset  . Cancer - Lung Mother        smoker  . Skin cancer Mother   . COPD Mother   . Cancer - Lung Father        smoker  . Heart disease Maternal Grandmother   . Heart disease Maternal Grandfather   . Heart disease Paternal Grandmother   .  Heart disease Paternal Grandfather   . Colon cancer Paternal Grandfather 64  . Breast cancer Neg Hx     Social History Social History   Tobacco Use  . Smoking status: Former Smoker    Packs/day: 0.50    Years: 15.00    Pack years: 7.50    Start date: 1980    Last attempt to quit: 1996    Years since quitting: 24.2  . Smokeless tobacco: Never Used  . Tobacco comment: quit 1997  Substance Use Topics  . Alcohol use: Yes    Comment: socially   . Drug use: No     Allergies   Codeine phosphate; Penicillins; and Sulfonamide derivatives   Review of Systems Review of Systems  Constitutional: Positive for fever.  Respiratory: Positive for cough and shortness of breath.   Cardiovascular: Positive for chest pain. Negative for leg swelling.  Gastrointestinal: Negative for vomiting.  All other systems reviewed and are negative.    Physical Exam Updated Vital  Signs BP 123/74 (BP Location: Right Arm)   Pulse 67   Temp 97.9 F (36.6 C) (Oral)   Resp 16   Ht 5\' 5"  (1.651 m)   Wt 82.1 kg   SpO2 98%   BMI 30.12 kg/m   Physical Exam Vitals signs and nursing note reviewed.  Constitutional:      General: She is not in acute distress.    Appearance: She is well-developed. She is not ill-appearing or diaphoretic.  HENT:     Head: Normocephalic and atraumatic.     Right Ear: External ear normal.     Left Ear: External ear normal.     Nose: Nose normal.  Eyes:     General:        Right eye: No discharge.        Left eye: No discharge.  Cardiovascular:     Rate and Rhythm: Normal rate and regular rhythm.     Heart sounds: Normal heart sounds.  Pulmonary:     Effort: Pulmonary effort is normal.     Breath sounds: Normal breath sounds. No wheezing, rhonchi or rales.  Chest:     Chest wall: No tenderness.  Abdominal:     General: There is no distension.     Palpations: Abdomen is soft.     Tenderness: There is no abdominal tenderness.  Musculoskeletal:     Right lower leg: No edema.     Left lower leg: No edema.  Skin:    General: Skin is warm and dry.  Neurological:     Mental Status: She is alert.  Psychiatric:        Mood and Affect: Mood is not anxious.      ED Treatments / Results  Labs (all labs ordered are listed, but only abnormal results are displayed) Labs Reviewed  BASIC METABOLIC PANEL - Abnormal; Notable for the following components:      Result Value   Sodium 133 (*)    Glucose, Bld 120 (*)    All other components within normal limits  TROPONIN I  CBC WITH DIFFERENTIAL/PLATELET    EKG EKG Interpretation  Date/Time:  Tuesday March 08 2019 13:54:43 EDT Ventricular Rate:  63 PR Interval:    QRS Duration: 90 QT Interval:  422 QTC Calculation: 432 R Axis:   83 Text Interpretation:  Normal sinus rhythm no acute ST/T changes No old tracing to compare Confirmed by Sherwood Gambler [id number]) on 03/08/2019 1:58:37  PM   Radiology Dg  Chest Portable 1 View  Result Date: 03/08/2019 CLINICAL DATA:  58 year old female with flu-like symptoms for the past 6 days EXAM: PORTABLE CHEST 1 VIEW COMPARISON:  Prior chest x-ray 08/29/2014 FINDINGS: The lungs are clear and negative for focal airspace consolidation, pulmonary edema or suspicious pulmonary nodule. No pleural effusion or pneumothorax. Cardiac and mediastinal contours are within normal limits. No acute fracture or lytic or blastic osseous lesions. The visualized upper abdominal bowel gas pattern is unremarkable. IMPRESSION: No active disease. Electronically Signed   By: Jacqulynn Cadet M.D.   On: 03/08/2019 15:19    Procedures Procedures (including critical care time)  Medications Ordered in ED Medications  albuterol (PROVENTIL HFA;VENTOLIN HFA) 108 (90 Base) MCG/ACT inhaler 2 puff (2 puffs Inhalation Given 03/08/19 1453)     Initial Impression / Assessment and Plan / ED Course  I have reviewed the triage vital signs and the nursing notes.  Pertinent labs & imaging results that were available during my care of the patient were reviewed by me and considered in my medical decision making (see chart for details).        Patient likely has a viral respiratory illness.  In the current setting it could be mild COVID-19 or multiple other viral illnesses.  I will ask her to self quarantine.  She does not have increased work of breathing here, has normal O2 sats, and is afebrile.  She does note some nonspecific chest pain but given the length of time this is been ongoing with negative troponin and benign ECG I think ACS is pretty unlikely.  She is low risk for PE and with no tachycardia or hypoxia, I think it is very unlikely and do not think testing is needed.  We discussed return precautions but she appears stable for discharge home.  Adrienne Burke was evaluated in Emergency Department on 03/08/2019 for the symptoms described in the history of present illness.  She was evaluated in the context of the global COVID-19 pandemic, which necessitated consideration that the patient might be at risk for infection with the SARS-CoV-2 virus that causes COVID-19. Institutional protocols and algorithms that pertain to the evaluation of patients at risk for COVID-19 are in a state of rapid change based on information released by regulatory bodies including the CDC and federal and state organizations. These policies and algorithms were followed during the patient's care in the ED.   Final Clinical Impressions(s) / ED Diagnoses   Final diagnoses:  Cough  Viral upper respiratory tract infection with cough  Nonspecific chest pain    ED Discharge Orders    None       Sherwood Gambler, MD 03/08/19 1530

## 2019-03-08 NOTE — Telephone Encounter (Addendum)
Incoming call from  Patient with a complaint of  complaint of  Sore throat,dry cough and chest tightness Patient states that she had a virtual appointment on Friday with Dr.  Larose Kells.  Patient states that all Sx. Have become worse  over the weekend.   Reports a temp of 100.  Reports chest  Tightness also.  Reports lungs hurt, Sx.  Gets worse with exertion. Rates it as uncomfortable. Patient states that she also has a dry cough and dizziness patient request a return phone call from Dr.  Larose Kells. to discuss her Sx.         Call to Patient to relate that our offices are not testing for Covid-19 any longer.  Provided [facility] Department  contact number. Patient voices concern about her lungs.    Reason for Disposition . [1] Patient claims chest pain is same as previously diagnosed "heartburn" AND [2] describes burning in chest AND [3] accompanying sour taste in mouth  Answer Assessment - Initial Assessment Questions 1. LOCATION: "Where does it hurt?"       Whole area. 2. RADIATION: "Does the pain go anywhere else?" (e.g., into neck, jaw, arms, back)     , lungs hurt  3. ONSET: "When did the chest pain begin?" (Minutes, hours or days)      Last thursday 4. PATTERN "Does the pain come and go, or has it been constant since it started?"  "Does it get worse with exertion?"      yes 5. DURATION: "How [place] does it last" (e.g., seconds, minutes, hours)     *No Answer* 6. SEVERITY: "How bad is the pain?"  (e.g., Scale 1-10; mild, moderate, or severe)    - MILD (1-3): doesn't interfere with normal activities     - MODERATE (4-7): interferes with normal activities or awakens from sleep    - SEVERE (8-10): excruciating pain, unable to do any normal activities       uncomfortable 7. CARDIAC RISK FACTORS: "Do you have any history of heart problems or risk factors for heart disease?" (e.g., prior heart attack, angina; high blood pressure, diabetes, being overweight, high cholesterol, smoking, or strong  family history of heart disease)     denies 8. PULMONARY RISK FACTORS: "Do you have any history of lung disease?"  (e.g., blood clots in lung, asthma, emphysema, birth control pills)     denies 9. CAUSE: "What do you think is causing the chest pain?"     *No Answer* 10. OTHER SYMPTOMS: "Do you have any other symptoms?" (e.g., dizziness, nausea, vomiting, sweating, fever, difficulty breathing, cough)       Fever cough dizzziness 11. PREGNANCY: "Is there any chance you are pregnant?" "When was your last menstrual period?"       *No Answer*denies  Protocols used: CHEST PAIN-A-AH

## 2019-03-08 NOTE — Telephone Encounter (Signed)
I spoke with the patient, since the video visit with me 4 days ago, she went to urgent care, diagnosed with a sinus infection and prescribed a Z-Pak. She is calling because she is not getting better: Low-grade fever, headache, sore throat, earache, chest pain "my whole chest hurts". She has a mild dry cough Mild DOE. Denies any rash, no nasal discharge. She requests "ciprofloxacin" as it helped in the past with sinus infections. We had a [place] conversation about her symptoms, she has a viral syndrome, possibly coronavirus. I do not think another antibiotic will make a difference. We extensively discussed the need to take fluids, use Tylenol, Mucinex, finish a Z-Pak since she already started it. If she gets worse, go straight to the ER rather than urgent care.  She is aware that she could quickly get worse and I urged her to be vigilant. If she is not getting worse and gradually improves, then nothing else is needed. She verbalized understanding, she did not sound in any physical distress during this phone call.

## 2019-05-21 DIAGNOSIS — H8110 Benign paroxysmal vertigo, unspecified ear: Secondary | ICD-10-CM | POA: Diagnosis not present

## 2019-05-24 ENCOUNTER — Telehealth: Payer: Self-pay | Admitting: Internal Medicine

## 2019-05-24 NOTE — Telephone Encounter (Signed)
Lorazepam refill.   Last OV: 03/04/2019 Last Fill: 12/03/2018 #30 and 5RF UDS: 06/04/2018 Low risk

## 2019-05-24 NOTE — Telephone Encounter (Signed)
Can you help schedule cpe (bcbs)- can be virtual or in person w/ [facility]. Thank you.

## 2019-05-24 NOTE — Telephone Encounter (Signed)
Schedule a cpx at her convenience . rx sent

## 2019-05-25 DIAGNOSIS — J0100 Acute maxillary sinusitis, unspecified: Secondary | ICD-10-CM | POA: Diagnosis not present

## 2019-05-25 DIAGNOSIS — M25511 Pain in right shoulder: Secondary | ICD-10-CM | POA: Diagnosis not present

## 2019-05-25 DIAGNOSIS — R42 Dizziness and giddiness: Secondary | ICD-10-CM | POA: Diagnosis not present

## 2019-05-26 ENCOUNTER — Telehealth: Payer: Self-pay | Admitting: Internal Medicine

## 2019-05-26 NOTE — Telephone Encounter (Signed)
Called on 06/16 and 06/18 left messages

## 2019-05-26 NOTE — Telephone Encounter (Signed)
Called left pt another message to call in for cpe appt

## 2019-05-27 ENCOUNTER — Ambulatory Visit: Payer: BLUE CROSS/BLUE SHIELD | Admitting: Family Medicine

## 2019-05-27 DIAGNOSIS — L570 Actinic keratosis: Secondary | ICD-10-CM | POA: Diagnosis not present

## 2019-05-27 DIAGNOSIS — L219 Seborrheic dermatitis, unspecified: Secondary | ICD-10-CM | POA: Diagnosis not present

## 2019-05-27 DIAGNOSIS — L821 Other seborrheic keratosis: Secondary | ICD-10-CM | POA: Diagnosis not present

## 2019-06-01 ENCOUNTER — Ambulatory Visit: Payer: Self-pay

## 2019-06-01 ENCOUNTER — Ambulatory Visit (INDEPENDENT_AMBULATORY_CARE_PROVIDER_SITE_OTHER): Payer: BC Managed Care – PPO | Admitting: Family Medicine

## 2019-06-01 ENCOUNTER — Other Ambulatory Visit: Payer: Self-pay

## 2019-06-01 ENCOUNTER — Encounter: Payer: Self-pay | Admitting: Family Medicine

## 2019-06-01 VITALS — BP 110/74 | HR 68 | Ht 65.0 in | Wt 187.0 lb

## 2019-06-01 DIAGNOSIS — M25511 Pain in right shoulder: Secondary | ICD-10-CM | POA: Diagnosis not present

## 2019-06-01 MED ORDER — GABAPENTIN 100 MG PO CAPS: 200.0000 mg | ORAL_CAPSULE | Freq: Every day | ORAL | 3 refills | Status: DC

## 2019-06-01 NOTE — Progress Notes (Signed)
[facility] [address] [place], [place] [address] Phone: [phone] Subjective:   Adrienne Burke, am serving as a scribe for Dr. Hulan Saas.    CC: Right shoulder pain  [id number]  Adrienne Burke is a 58 y.o. female coming in with complaint of right shoulder pain. Pain is not as serve as it has been. Pain is in right scapula region. Pain radiates down her arm into her elbow and forearm. Pain started one week ago after doing yoga and neck stretching. Does complain of dizziness associated with injury. Has been using Advil prn.     Past Medical History:  Diagnosis Date  . Actinic keratitis    hx of  . Anxiety and depression   . Atrophic vaginitis   . Colon polyp    Hyperplastic polyps 10/2004, Normal Cscope 2010   . ENDOMETRIOSIS   . Genital HSV   . HYPERLIPIDEMIA   . Insomnia   . Ovarian cyst    bilateral, s/p drainage   Past Surgical History:  Procedure Laterality Date  . ANKLE FRACTURE SURGERY    . APPENDECTOMY    . BREAST IMPLANT EXCHANGE     breast lift-implant 2009, repeated 08-2009    . EYE SURGERY     [place] for vision correction 02-2012  . TONSILLECTOMY AND ADENOIDECTOMY  1966  . TUBAL LIGATION    . uterine ablation     Social History   Socioeconomic History  . Marital status: Married    Spouse name: Not on file  . Number of children: 2  . Years of education: Not on file  . Highest education level: Not on file  Occupational History  . Occupation: pay roll-tax analyst  Social Needs  . Financial resource strain: Not on file  . Food insecurity    Worry: Not on file    Inability: Not on file  . Transportation needs    Medical: Not on file    Non-medical: Not on file  Tobacco Use  . Smoking status: Former Smoker    Packs/day: 0.50    Years: 15.00    Pack years: 7.50    Start date: [place] date: 1996    Years since quitting: 24.4  . Smokeless tobacco: Never Used  . Tobacco comment: quit 1997  Substance and  Sexual Activity  . Alcohol use: Yes    Comment: socially   . Drug use: Burke  . Sexual activity: Not on file  Lifestyle  . Physical activity    Days per week: Not on file    Minutes per session: Not on file  . Stress: Not on file  Relationships  . Social Herbalist on phone: Not on file    Gets together: Not on file    Attends religious service: Not on file    Active member of club or organization: Not on file    Attends meetings of clubs or organizations: Not on file    Relationship status: Not on file  Other Topics Concern  . Not on file  Social History Narrative   G3 P2 ab 1     Married to [place] , 1 child alive , lost one            Allergies  Allergen Reactions  . Codeine Phosphate     REACTION: unspecified  . Penicillins     REACTION: unspecified  . Sulfonamide Derivatives     REACTION: unspecified  Family History  Problem Relation Age of Onset  . Cancer - Lung Mother        smoker  . Skin cancer Mother   . COPD Mother   . Cancer - Lung Father        smoker  . Heart disease Maternal Grandmother   . Heart disease Maternal Grandfather   . Heart disease Paternal Grandmother   . Heart disease Paternal Grandfather   . Colon cancer Paternal Grandfather 35  . Breast cancer Neg Hx      Current Outpatient Medications (Cardiovascular):  .  atorvastatin (LIPITOR) 10 MG tablet, Take 1 tablet (10 mg total) by mouth daily.     Current Outpatient Medications (Other):  .  doxycycline (VIBRA-TABS) 100 MG tablet, Take 1 tablet (100 mg total) by mouth 2 (two) times daily. Marland Kitchen  LORazepam (ATIVAN) 0.5 MG tablet, TAKE 1 TAB BY MOUTH AT BEDTIME AS NEEDED FOR ANXIETY .  Multiple Vitamins-Minerals (MULTIVITAMIN,TX-MINERALS) tablet, Take 1 tablet by mouth daily.   .  Probiotic Product (PROBIOTIC-10) CAPS, Take 1 tablet by mouth daily. .  sertraline (ZOLOFT) 50 MG tablet, Take 1 tablet (50 mg total) by mouth daily. Marland Kitchen  gabapentin (NEURONTIN) 100 MG capsule, Take 2  capsules (200 mg total) by mouth at bedtime.    Past medical history, social, surgical and family history all reviewed in electronic medical record.  Burke pertanent information unless stated regarding to the chief complaint.   Review of Systems:  Burke headache, visual changes, nausea, vomiting, diarrhea, constipation, dizziness, abdominal pain, skin rash, fevers, chills, night sweats, weight loss, swollen lymph nodes, body aches, joint swelling, chest pain, shortness of breath, mood changes.  Positive muscle aches  Objective  Blood pressure 110/74, pulse 68, height 5\' 5"  (1.651 m), weight 187 lb (84.8 kg), SpO2 97 %.   General: Burke apparent distress alert and oriented x3 mood and affect normal, dressed appropriately.  HEENT: Pupils equal, extraocular movements intact  Respiratory: Patient's speak in full sentences and does not appear short of breath  Cardiovascular: Burke lower extremity edema, non tender, Burke erythema  Skin: Warm dry intact with Burke signs of infection or rash on extremities or on axial skeleton.  Abdomen: Soft nontender  Neuro: Cranial nerves II through XII are intact, neurovascularly intact in all extremities with 2+ DTRs and 2+ pulses.  Lymph: Burke lymphadenopathy of posterior or anterior cervical chain or axillae bilaterally.  Gait normal with good balance and coordination.  MSK:  Non tender with full range of motion and good stability and symmetric strength and tone of  elbows, wrist, hip, knee and ankles bilaterally.   Shoulder: Right Inspection reveals Burke abnormalities, atrophy or asymmetry. Palpation is normal with Burke tenderness over AC joint or bicipital groove. ROM is full in all planes passively. Rotator cuff strength normal throughout. signs of impingement with positive Neer and Hawkin's tests, but negative empty can sign. Speeds and Yergason's tests normal. Burke labral pathology noted with negative Obrien's, negative clunk and good stability. Normal scapular function  observed. Burke painful arc and Burke drop arm sign. Burke apprehension sign Trigger points noted in the right trapezius muscle.   MSK US performed of: Right This study was ordered, performed, and interpreted by Charlann Boxer D.O.  Shoulder:   Supraspinatus:  Appears normal on Ellenberger and transverse views, Bursal bulge seen with shoulder abduction on impingement view. Infraspinatus:  Appears normal on Fetch and transverse views. Significant increase in Doppler flow Subscapularis:  Appears normal on Tenbrink and  transverse views. Positive bursa Teres Minor:  Appears normal on Haire and transverse views. AC joint:  Capsule undistended, Burke geyser sign. Glenohumeral Joint:  Appears normal without effusion. Glenoid Labrum:  Intact without visualized tears. Biceps Tendon:  Appears normal on Donaway and transverse views, Burke fraying of tendon, tendon located in intertubercular groove, Burke subluxation with shoulder internal or external rotation.  Impression: Subacromial bursitis very mild   After verbal consent patient was prepped with alcohol swabs and with a 25-gauge half inch needle was injected with 0.5 cc of 0.5% Marcaine and 1 cc of Kenalog 40 mg/mL in 3 distinct trigger points.  Burke blood loss.  Postinjection instructions given       Impression and Recommendations:     This case required medical decision making of moderate complexity. The above documentation has been reviewed and is accurate and complete Lyndal Pulley, DO       Note: This dictation was prepared with Dragon dictation along with smaller phrase technology. Any transcriptional errors that result from this process are unintentional.

## 2019-06-01 NOTE — Assessment & Plan Note (Signed)
Injection given today.  Tolerated procedure well.  Discussed icing regimen and home exercise.  Discussed which activities to do which wants to avoid.  Gabapentin given for nighttime relief.  Follow-up again in 3 weeks if not better.  Held on x-rays with patient already making some progress.

## 2019-06-01 NOTE — Patient Instructions (Signed)
Good to see you.  Ice 20 minutes 2 times daily. Usually after activity and before bed. Exercises 3 times a week.  Gabapentin 200 mg at night  See me again in 3-4 weeks

## 2019-06-03 DIAGNOSIS — Z03818 Encounter for observation for suspected exposure to other biological agents ruled out: Secondary | ICD-10-CM | POA: Diagnosis not present

## 2019-06-27 NOTE — Progress Notes (Signed)
[facility] [address] [place], [place] [address] Phone: [phone] Subjective:   I Adrienne Burke am serving as a Education administrator for Dr. Hulan Saas.      CC: Shoulder pain follow-up  [id number]  Adrienne Burke is a 58 y.o. female coming in with complaint of right shoulder pain. States the shoulder is feeling fine.  Patient states the feeling of 100% better at this time.  Not having any significant pain whatsoever.  Increasing range of motion.  Some tightness in the area but very intermittent.      Past Medical History:  Diagnosis Date  . Actinic keratitis    hx of  . Anxiety and depression   . Atrophic vaginitis   . Colon polyp    Hyperplastic polyps 10/2004, Normal Cscope 2010   . ENDOMETRIOSIS   . Genital HSV   . HYPERLIPIDEMIA   . Insomnia   . Ovarian cyst    bilateral, s/p drainage   Past Surgical History:  Procedure Laterality Date  . ANKLE FRACTURE SURGERY    . APPENDECTOMY    . BREAST IMPLANT EXCHANGE     breast lift-implant 2009, repeated 08-2009    . EYE SURGERY     [place] for vision correction 02-2012  . TONSILLECTOMY AND ADENOIDECTOMY  1966  . TUBAL LIGATION    . uterine ablation     Social History   Socioeconomic History  . Marital status: Married    Spouse name: Not on file  . Number of children: 2  . Years of education: Not on file  . Highest education level: Not on file  Occupational History  . Occupation: pay roll-tax analyst  Social Needs  . Financial resource strain: Not on file  . Food insecurity    Worry: Not on file    Inability: Not on file  . Transportation needs    Medical: Not on file    Non-medical: Not on file  Tobacco Use  . Smoking status: Former Smoker    Packs/day: 0.50    Years: 15.00    Pack years: 7.50    Start date: [place] date: 1996    Years since quitting: 24.5  . Smokeless tobacco: Never Used  . Tobacco comment: quit 1997  Substance and Sexual Activity  . Alcohol use: Yes   Comment: socially   . Drug use: No  . Sexual activity: Not on file  Lifestyle  . Physical activity    Days per week: Not on file    Minutes per session: Not on file  . Stress: Not on file  Relationships  . Social Herbalist on phone: Not on file    Gets together: Not on file    Attends religious service: Not on file    Active member of club or organization: Not on file    Attends meetings of clubs or organizations: Not on file    Relationship status: Not on file  Other Topics Concern  . Not on file  Social History Narrative   G3 P2 ab 1     Married to [place] , 1 child alive , lost one            Allergies  Allergen Reactions  . Codeine Phosphate     REACTION: unspecified  . Penicillins     REACTION: unspecified  . Sulfonamide Derivatives     REACTION: unspecified   Family History  Problem Relation Age of  Onset  . Cancer - Lung Mother        smoker  . Skin cancer Mother   . COPD Mother   . Cancer - Lung Father        smoker  . Heart disease Maternal Grandmother   . Heart disease Maternal Grandfather   . Heart disease Paternal Grandmother   . Heart disease Paternal Grandfather   . Colon cancer Paternal Grandfather 92  . Breast cancer Neg Hx      Current Outpatient Medications (Cardiovascular):  .  atorvastatin (LIPITOR) 10 MG tablet, Take 1 tablet (10 mg total) by mouth daily.     Current Outpatient Medications (Other):  .  doxycycline (VIBRA-TABS) 100 MG tablet, Take 1 tablet (100 mg total) by mouth 2 (two) times daily. Marland Kitchen  gabapentin (NEURONTIN) 100 MG capsule, Take 2 capsules (200 mg total) by mouth at bedtime. Marland Kitchen  LORazepam (ATIVAN) 0.5 MG tablet, TAKE 1 TAB BY MOUTH AT BEDTIME AS NEEDED FOR ANXIETY .  Multiple Vitamins-Minerals (MULTIVITAMIN,TX-MINERALS) tablet, Take 1 tablet by mouth daily.   .  Probiotic Product (PROBIOTIC-10) CAPS, Take 1 tablet by mouth daily. .  sertraline (ZOLOFT) 50 MG tablet, Take 1 tablet (50 mg total) by mouth daily.     Past medical history, social, surgical and family history all reviewed in electronic medical record.  No pertanent information unless stated regarding to the chief complaint.   Review of Systems:  No headache, visual changes, nausea, vomiting, diarrhea, constipation, dizziness, abdominal pain, skin rash, fevers, chills, night sweats, weight loss, swollen lymph nodes, body aches, joint swelling,  chest pain, shortness of breath, mood changes.  Mild positive muscle aches  Objective  Blood pressure 122/80, pulse 62, height 5\' 5"  (1.651 m), SpO2 96 %.     General: No apparent distress alert and oriented x3 mood and affect normal, dressed appropriately.  HEENT: Pupils equal, extraocular movements intact  Respiratory: Patient's speak in full sentences and does not appear short of breath  Cardiovascular: No lower extremity edema, non tender, no erythema  Skin: Warm dry intact with no signs of infection or rash on extremities or on axial skeleton.  Abdomen: Soft nontender  Neuro: Cranial nerves II through XII are intact, neurovascularly intact in all extremities with 2+ DTRs and 2+ pulses.  Lymph: No lymphadenopathy of posterior or anterior cervical chain or axillae bilaterally.  Gait normal with good balance and coordination.  MSK:  Non tender with full range of motion and good stability and symmetric strength and tone of shoulders, elbows, wrist, hip, knee and ankles bilaterally.      Impression and Recommendations:    . The above documentation has been reviewed and is accurate and complete Lyndal Pulley, DO       Note: This dictation was prepared with Dragon dictation along with smaller phrase technology. Any transcriptional errors that result from this process are unintentional.

## 2019-06-28 ENCOUNTER — Ambulatory Visit (INDEPENDENT_AMBULATORY_CARE_PROVIDER_SITE_OTHER): Payer: BC Managed Care – PPO | Admitting: Family Medicine

## 2019-06-28 ENCOUNTER — Other Ambulatory Visit: Payer: Self-pay

## 2019-06-28 ENCOUNTER — Encounter: Payer: Self-pay | Admitting: Family Medicine

## 2019-06-28 DIAGNOSIS — M25511 Pain in right shoulder: Secondary | ICD-10-CM

## 2019-06-28 NOTE — Patient Instructions (Addendum)
Good to see you 100 mg gabapentin for a week then  Discontinue. Exercises 2 times a week  Ice is yru friend See me agai in 6 weeks if not happy with it  Otherwise can call us when you nee dus at [phone]

## 2019-06-28 NOTE — Assessment & Plan Note (Signed)
Patient is doing significantly better with conservative therapy.  As needed, can repeat trigger point injections when needed.  Follow-up again 6 weeks if not completely resolved

## 2019-08-15 ENCOUNTER — Other Ambulatory Visit: Payer: Self-pay | Admitting: Internal Medicine

## 2019-08-23 ENCOUNTER — Other Ambulatory Visit: Payer: Self-pay | Admitting: Internal Medicine

## 2019-08-23 ENCOUNTER — Ambulatory Visit: Payer: BC Managed Care – PPO | Admitting: Family Medicine

## 2019-08-27 ENCOUNTER — Encounter: Payer: Self-pay | Admitting: Internal Medicine

## 2019-08-29 ENCOUNTER — Ambulatory Visit (INDEPENDENT_AMBULATORY_CARE_PROVIDER_SITE_OTHER): Payer: BC Managed Care – PPO | Admitting: Internal Medicine

## 2019-08-29 ENCOUNTER — Other Ambulatory Visit: Payer: Self-pay

## 2019-08-29 DIAGNOSIS — F329 Major depressive disorder, single episode, unspecified: Secondary | ICD-10-CM

## 2019-08-29 DIAGNOSIS — F419 Anxiety disorder, unspecified: Secondary | ICD-10-CM | POA: Diagnosis not present

## 2019-08-29 DIAGNOSIS — F32A Depression, unspecified: Secondary | ICD-10-CM

## 2019-08-29 MED ORDER — LORAZEPAM 0.5 MG PO TABS: 0.5000 mg | ORAL_TABLET | Freq: Every evening | ORAL | 3 refills | Status: DC | PRN

## 2019-08-29 MED ORDER — ATORVASTATIN CALCIUM 10 MG PO TABS: 10.0000 mg | ORAL_TABLET | Freq: Every day | ORAL | 3 refills | Status: DC

## 2019-08-29 MED ORDER — SERTRALINE HCL 50 MG PO TABS: 50.0000 mg | ORAL_TABLET | Freq: Every day | ORAL | 3 refills | Status: DC

## 2019-08-29 NOTE — Progress Notes (Signed)
Subjective:    Patient ID: Adrienne Burke, female    DOB: Sep 22, 1961, 58 y.o.   MRN: [id number]  DOS:  08/29/2019 Type of visit - description: Virtual Visit via Video Note  I connected with@   by a video enabled telemedicine application and verified that I am speaking with the correct person using two identifiers.   THIS ENCOUNTER IS A VIRTUAL VISIT DUE TO COVID-19 - PATIENT WAS NOT SEEN IN THE OFFICE. PATIENT HAS CONSENTED TO VIRTUAL VISIT / TELEMEDICINE VISIT   Location of patient: home  Location of provider: office  I discussed the limitations of evaluation and management by telemedicine and the availability of in person appointments. The patient expressed understanding and agreed to proceed.  History of Present Illness: Follow-up Anxiety depression: Symptoms controlled, needs a refill Hyperglycemia: Concerned about her blood sugar, has gained weight recently due to the quarantine.    Review of Systems Denies chest pain no difficulty breathing No nausea, vomiting, diarrhea  Past Medical History:  Diagnosis Date  . Actinic keratitis    hx of  . Anxiety and depression   . Atrophic vaginitis   . Colon polyp    Hyperplastic polyps 10/2004, Normal Cscope 2010   . ENDOMETRIOSIS   . Genital HSV   . HYPERLIPIDEMIA   . Insomnia   . Ovarian cyst    bilateral, s/p drainage    Past Surgical History:  Procedure Laterality Date  . ANKLE FRACTURE SURGERY    . APPENDECTOMY    . BREAST IMPLANT EXCHANGE     breast lift-implant 2009, repeated 08-2009    . EYE SURGERY     [place] for vision correction 02-2012  . TONSILLECTOMY AND ADENOIDECTOMY  1966  . TUBAL LIGATION    . uterine ablation      Social History   Socioeconomic History  . Marital status: Married    Spouse name: Not on file  . Number of children: 2  . Years of education: Not on file  . Highest education level: Not on file  Occupational History  . Occupation: pay roll-tax analyst  Social Needs  . Financial  resource strain: Not on file  . Food insecurity    Worry: Not on file    Inability: Not on file  . Transportation needs    Medical: Not on file    Non-medical: Not on file  Tobacco Use  . Smoking status: Former Smoker    Packs/day: 0.50    Years: 15.00    Pack years: 7.50    Start date: [place] date: 1996    Years since quitting: 24.7  . Smokeless tobacco: Never Used  . Tobacco comment: quit 1997  Substance and Sexual Activity  . Alcohol use: Yes    Comment: socially   . Drug use: No  . Sexual activity: Not on file  Lifestyle  . Physical activity    Days per week: Not on file    Minutes per session: Not on file  . Stress: Not on file  Relationships  . Social Herbalist on phone: Not on file    Gets together: Not on file    Attends religious service: Not on file    Active member of club or organization: Not on file    Attends meetings of clubs or organizations: Not on file    Relationship status: Not on file  . Intimate partner violence    Fear of current or ex partner:  Not on file    Emotionally abused: Not on file    Physically abused: Not on file    Forced sexual activity: Not on file  Other Topics Concern  . Not on file  Social History Narrative   G3 P2 ab 1     Married to [place] , 1 child alive , lost one               Allergies as of 08/29/2019      Reactions   Codeine Phosphate    REACTION: unspecified   Penicillins    REACTION: unspecified   Sulfonamide Derivatives    REACTION: unspecified      Medication List       Accurate as of August 29, 2019  4:09 PM. If you have any questions, ask your nurse or doctor.        atorvastatin 10 MG tablet Commonly known as: LIPITOR Take 1 tablet (10 mg total) by mouth daily.   doxycycline 100 MG tablet Commonly known as: VIBRA-TABS Take 1 tablet (100 mg total) by mouth 2 (two) times daily.   gabapentin 100 MG capsule Commonly known as: NEURONTIN Take 2 capsules (200 mg total) by  mouth at bedtime.   LORazepam 0.5 MG tablet Commonly known as: ATIVAN TAKE 1 TAB BY MOUTH AT BEDTIME AS NEEDED FOR ANXIETY   multivitamin,tx-minerals tablet Take 1 tablet by mouth daily.   Probiotic-10 Caps Take 1 tablet by mouth daily.   sertraline 50 MG tablet Commonly known as: ZOLOFT Take 1 tablet (50 mg total) by mouth daily.           Objective:   Physical Exam There were no vitals taken for this visit. This is a virtual video visit.  She is alert oriented x3 and in no apparent distress    Assessment      Assessment  Hyperglycemia A1c 6.0 01-2018 Hyperlipidemia Anxiety, depression GYN: Endometriosis. s/p exploration , DUB, endometrial ablasion H/o actinic keratosis, sees derm q year as of 05/2018  Plan: Hyperglycemia: Last A1c 6.0, she has gained some weight, discussed diet and exercise.  Recheck on RTC Anxiety depression: Refill medications Recommend early flu shot We will call her and schedule a CPX at her convenience.    I discussed the assessment and treatment plan with the patient. The patient was provided an opportunity to ask questions and all were answered. The patient agreed with the plan and demonstrated an understanding of the instructions.   The patient was advised to call back or seek an in-person evaluation if the symptoms worsen or if the condition fails to improve as anticipated.

## 2019-08-29 NOTE — Assessment & Plan Note (Signed)
Hyperglycemia: Last A1c 6.0, she has gained some weight, discussed diet and exercise.  Recheck on RTC Anxiety depression: Refill medications Recommend early flu shot We will call her and schedule a CPX at her convenience.

## 2019-09-19 ENCOUNTER — Encounter: Payer: Self-pay | Admitting: Internal Medicine

## 2019-09-23 ENCOUNTER — Encounter: Payer: Self-pay | Admitting: Internal Medicine

## 2019-09-27 DIAGNOSIS — N644 Mastodynia: Secondary | ICD-10-CM | POA: Diagnosis not present

## 2019-09-28 ENCOUNTER — Other Ambulatory Visit: Payer: Self-pay | Admitting: Obstetrics and Gynecology

## 2019-09-28 DIAGNOSIS — N644 Mastodynia: Secondary | ICD-10-CM

## 2019-09-30 ENCOUNTER — Other Ambulatory Visit: Payer: Self-pay | Admitting: Obstetrics and Gynecology

## 2019-09-30 ENCOUNTER — Ambulatory Visit
Admission: RE | Admit: 2019-09-30 | Discharge: 2019-09-30 | Disposition: A | Discharge status: Home / Self Care | Payer: BC Managed Care – PPO | Source: Ambulatory Visit | Attending: Obstetrics and Gynecology | Admitting: Obstetrics and Gynecology

## 2019-09-30 ENCOUNTER — Ambulatory Visit: Admission: RE | Admit: 2019-09-30 | Payer: BC Managed Care – PPO | Source: Ambulatory Visit

## 2019-09-30 ENCOUNTER — Other Ambulatory Visit: Payer: Self-pay

## 2019-09-30 DIAGNOSIS — R921 Mammographic calcification found on diagnostic imaging of breast: Secondary | ICD-10-CM

## 2019-09-30 DIAGNOSIS — N644 Mastodynia: Secondary | ICD-10-CM

## 2019-10-07 ENCOUNTER — Other Ambulatory Visit: Payer: Self-pay

## 2019-10-07 DIAGNOSIS — L821 Other seborrheic keratosis: Secondary | ICD-10-CM | POA: Diagnosis not present

## 2019-10-07 DIAGNOSIS — L219 Seborrheic dermatitis, unspecified: Secondary | ICD-10-CM | POA: Diagnosis not present

## 2019-10-07 DIAGNOSIS — L570 Actinic keratosis: Secondary | ICD-10-CM | POA: Diagnosis not present

## 2019-10-10 ENCOUNTER — Other Ambulatory Visit: Payer: Self-pay

## 2019-10-10 ENCOUNTER — Encounter: Payer: Self-pay | Admitting: Internal Medicine

## 2019-10-10 ENCOUNTER — Ambulatory Visit (INDEPENDENT_AMBULATORY_CARE_PROVIDER_SITE_OTHER): Payer: BC Managed Care – PPO | Admitting: Internal Medicine

## 2019-10-10 VITALS — BP 106/57 | HR 60 | Temp 96.5°F | Resp 16 | Ht 65.0 in | Wt 186.2 lb

## 2019-10-10 DIAGNOSIS — E785 Hyperlipidemia, unspecified: Secondary | ICD-10-CM | POA: Diagnosis not present

## 2019-10-10 DIAGNOSIS — Z1211 Encounter for screening for malignant neoplasm of colon: Secondary | ICD-10-CM

## 2019-10-10 DIAGNOSIS — F419 Anxiety disorder, unspecified: Secondary | ICD-10-CM

## 2019-10-10 DIAGNOSIS — Z0000 Encounter for general adult medical examination without abnormal findings: Secondary | ICD-10-CM

## 2019-10-10 DIAGNOSIS — Z79899 Other long term (current) drug therapy: Secondary | ICD-10-CM | POA: Diagnosis not present

## 2019-10-10 DIAGNOSIS — Z23 Encounter for immunization: Secondary | ICD-10-CM | POA: Diagnosis not present

## 2019-10-10 DIAGNOSIS — F329 Major depressive disorder, single episode, unspecified: Secondary | ICD-10-CM

## 2019-10-10 DIAGNOSIS — Z122 Encounter for screening for malignant neoplasm of respiratory organs: Secondary | ICD-10-CM

## 2019-10-10 DIAGNOSIS — F32A Depression, unspecified: Secondary | ICD-10-CM

## 2019-10-10 NOTE — Patient Instructions (Addendum)
  GO TO THE FRONT DESK Schedule an appointment for your blood work fasting at your earliest convenience  Schedule your next appointment   with me in a year, maybe sooner depending on your labs  To help you lose weight: Calorie counting? NOOM ?

## 2019-10-10 NOTE — Progress Notes (Signed)
Pre visit review using our clinic review tool, if applicable. No additional management support is needed unless otherwise documented below in the visit note. 

## 2019-10-10 NOTE — Progress Notes (Signed)
Subjective:    Patient ID: Adrienne Burke, female    DOB: 11/13/1961, 58 y.o.   MRN: [id number]  DOS:  10/10/2019 Type of visit - description: CPX No major concerns She gained some weight earlier this year due to the quarantine but she is getting back on track.  Still not at her or regional weight. Occasionally has chest tightness, this is going on for a while, ill-defined, it is 24/7, she thinks he is her "fear to lung cancer" which runs in the family. Was seen at the ER 02-2019 with CP, SOB, work-up negative. Specifically denies exertional symptoms, cough, wheezing, heartburn.  Emotionally doing well otherwise.  Review of Systems Other than above, a 14 point review of systems is negative    Past Medical History:  Diagnosis Date  . Actinic keratitis    hx of  . Anxiety and depression   . Atrophic vaginitis   . Colon polyp    Hyperplastic polyps 10/2004, Normal Cscope 2010   . ENDOMETRIOSIS   . Genital HSV   . HYPERLIPIDEMIA   . Insomnia   . Ovarian cyst    bilateral, s/p drainage    Past Surgical History:  Procedure Laterality Date  . ANKLE FRACTURE SURGERY    . APPENDECTOMY    . BREAST IMPLANT EXCHANGE     breast lift-implant 2009, repeated 08-2009    . EYE SURGERY     [place] for vision correction 02-2012  . TONSILLECTOMY AND ADENOIDECTOMY  1966  . TUBAL LIGATION    . uterine ablation      Social History   Socioeconomic History  . Marital status: Married    Spouse name: Not on file  . Number of children: 2  . Years of education: Not on file  . Highest education level: Not on file  Occupational History  . Occupation: pay roll-tax analyst  Social Needs  . Financial resource strain: Not on file  . Food insecurity    Worry: Not on file    Inability: Not on file  . Transportation needs    Medical: Not on file    Non-medical: Not on file  Tobacco Use  . Smoking status: Former Smoker    Packs/day: 0.50    Years: 15.00    Pack years: 7.50    Start date: [place] date: 1996    Years since quitting: 24.8  . Smokeless tobacco: Never Used  . Tobacco comment: quit 1997  Substance and Sexual Activity  . Alcohol use: Yes    Comment: socially   . Drug use: No  . Sexual activity: Not on file  Lifestyle  . Physical activity    Days per week: Not on file    Minutes per session: Not on file  . Stress: Not on file  Relationships  . Social Herbalist on phone: Not on file    Gets together: Not on file    Attends religious service: Not on file    Active member of club or organization: Not on file    Attends meetings of clubs or organizations: Not on file    Relationship status: Not on file  . Intimate partner violence    Fear of current or ex partner: Not on file    Emotionally abused: Not on file    Physically abused: Not on file    Forced sexual activity: Not on file  Other Topics Concern  . Not on file  Social History Narrative  G3 P2 ab 1     Married to [place] , 1 child alive , lost one              Family History  Problem Relation Age of Onset  . Cancer - Lung Mother        smoker  . Skin cancer Mother   . COPD Mother   . Cancer - Lung Father        smoker  . Heart disease Maternal Grandmother   . Heart disease Maternal Grandfather   . Heart disease Paternal Grandmother   . Heart disease Paternal Grandfather   . Colon cancer Paternal Grandfather 62  . Breast cancer Neg Hx      Allergies as of 10/10/2019      Reactions   Codeine Phosphate    REACTION: unspecified   Penicillins    REACTION: unspecified   Sulfonamide Derivatives    REACTION: unspecified      Medication List       Accurate as of October 10, 2019 11:59 PM. If you have any questions, ask your nurse or doctor.        atorvastatin 10 MG tablet Commonly known as: LIPITOR Take 1 tablet (10 mg total) by mouth daily.   LORazepam 0.5 MG tablet Commonly known as: ATIVAN Take 1 tablet (0.5 mg total) by mouth at bedtime as needed for anxiety.    multivitamin,tx-minerals tablet Take 1 tablet by mouth daily.   Probiotic-10 Caps Take 1 tablet by mouth daily.   sertraline 50 MG tablet Commonly known as: ZOLOFT Take 1 tablet (50 mg total) by mouth daily.           Objective:   Physical Exam BP (!) 106/57 (BP Location: Left Arm, Patient Position: Sitting, Cuff Size: Normal)   Pulse 60   Temp (!) 96.5 F (35.8 C) (Temporal)   Resp 16   Ht 5\' 5"  (1.651 m)   Wt 186 lb 4 oz (84.5 kg)   SpO2 100%   BMI 30.99 kg/m  General: Well developed, NAD, BMI noted Neck: No  thyromegaly  HEENT:  Normocephalic . Face symmetric, atraumatic Lungs:  CTA B Normal respiratory effort, no intercostal retractions, no accessory muscle use. Heart: RRR,  no murmur.  No pretibial edema bilaterally  Abdomen:  Not distended, soft, non-tender. No rebound or rigidity.   Skin: Exposed areas without rash. Not pale. Not jaundice Neurologic:  alert & oriented X3.  Speech normal, gait appropriate for age and unassisted Strength symmetric and appropriate for age.  Psych: Cognition and judgment appear intact.  Cooperative with normal attention span and concentration.  Behavior appropriate. No anxious or depressed appearing.     Assessment     Assessment  Hyperglycemia A1c 6.0 01-2018 Hyperlipidemia Anxiety, depression GYN: Endometriosis. s/p exploration , DUB, endometrial ablasion H/o actinic keratosis, sees derm q year as of 05/2018   PLAN Here for CPX Hyperglycemia: Check A1c Hyperlipidemia, continue Lipitor, labs. Anxiety, depression: On sertraline, Ativan, controlled, get a UDS Chest discomfort: As described above, unlikely to be cardiac related.  Observation.  Will call if features changes.  See comments under family history of lung cancer screening RTC 1 year depending on results

## 2019-10-11 ENCOUNTER — Other Ambulatory Visit: Payer: Self-pay

## 2019-10-11 ENCOUNTER — Other Ambulatory Visit (INDEPENDENT_AMBULATORY_CARE_PROVIDER_SITE_OTHER): Payer: BC Managed Care – PPO

## 2019-10-11 DIAGNOSIS — Z79899 Other long term (current) drug therapy: Secondary | ICD-10-CM

## 2019-10-11 DIAGNOSIS — E785 Hyperlipidemia, unspecified: Secondary | ICD-10-CM | POA: Diagnosis not present

## 2019-10-11 DIAGNOSIS — F329 Major depressive disorder, single episode, unspecified: Secondary | ICD-10-CM

## 2019-10-11 DIAGNOSIS — Z0000 Encounter for general adult medical examination without abnormal findings: Secondary | ICD-10-CM

## 2019-10-11 DIAGNOSIS — F419 Anxiety disorder, unspecified: Secondary | ICD-10-CM

## 2019-10-11 DIAGNOSIS — F32A Depression, unspecified: Secondary | ICD-10-CM

## 2019-10-11 LAB — COMPREHENSIVE METABOLIC PANEL
ALT: 7 U/L (ref 0–35)
AST: 16 U/L (ref 0–37)
Albumin: 4.3 g/dL (ref 3.5–5.2)
Alkaline Phosphatase: 66 U/L (ref 39–117)
BUN: 11 mg/dL (ref 6–23)
CO2: 27 mEq/L (ref 19–32)
Calcium: 9.4 mg/dL (ref 8.4–10.5)
Chloride: 104 mEq/L (ref 96–112)
Creatinine, Ser: 0.76 mg/dL (ref 0.40–1.20)
GFR: 78.63 mL/min (ref >60.00)
Glucose, Bld: 101 mg/dL — ABNORMAL HIGH (ref 70–99)
Potassium: 4.3 mEq/L (ref 3.5–5.1)
Sodium: 138 mEq/L (ref 135–145)
Total Bilirubin: 0.4 mg/dL (ref 0.2–1.2)
Total Protein: 6.6 g/dL (ref 6.0–8.3)

## 2019-10-11 LAB — LIPID PANEL
Cholesterol: 197 mg/dL (ref 0–200)
HDL: 56 mg/dL (ref >39.00)
LDL Cholesterol: 121 mg/dL — ABNORMAL HIGH (ref 0–99)
NonHDL: 141.36
Total CHOL/HDL Ratio: 4
Triglycerides: 100 mg/dL (ref 0.0–149.0)
VLDL: 20 mg/dL (ref 0.0–40.0)

## 2019-10-11 LAB — HEMOGLOBIN A1C: Hgb A1c MFr Bld: 5.9 % (ref 4.6–6.5)

## 2019-10-11 LAB — CBC WITH DIFFERENTIAL/PLATELET
Basophils Absolute: 0 10*3/uL (ref 0.0–0.1)
Basophils Relative: 0.9 % (ref 0.0–3.0)
Eosinophils Absolute: 0.1 10*3/uL (ref 0.0–0.7)
Eosinophils Relative: 1.2 % (ref 0.0–5.0)
HCT: 38.4 % (ref 36.0–46.0)
Hemoglobin: 12.8 g/dL (ref 12.0–15.0)
Lymphocytes Relative: 29.8 % (ref 12.0–46.0)
Lymphs Abs: 1.6 10*3/uL (ref 0.7–4.0)
MCHC: 33.2 g/dL (ref 30.0–36.0)
MCV: 93.3 fl (ref 78.0–100.0)
Monocytes Absolute: 0.3 10*3/uL (ref 0.1–1.0)
Monocytes Relative: 5.2 % (ref 3.0–12.0)
Neutro Abs: 3.4 10*3/uL (ref 1.4–7.7)
Neutrophils Relative %: 62.9 % (ref 43.0–77.0)
Platelets: 261 10*3/uL (ref 150.0–400.0)
RBC: 4.12 Mil/uL (ref 3.87–5.11)
RDW: 12.8 % (ref 11.5–15.5)
WBC: 5.4 10*3/uL (ref 4.0–10.5)

## 2019-10-11 NOTE — Assessment & Plan Note (Signed)
Here for CPX Hyperglycemia: Check A1c Hyperlipidemia, continue Lipitor, labs. Anxiety, depression: On sertraline, Ativan, controlled, get a UDS Chest discomfort: As described above, unlikely to be cardiac related.  Observation.  Will call if features changes.  See comments under family history of lung cancer screening RTC 1 year depending on results

## 2019-10-11 NOTE — Assessment & Plan Note (Signed)
-  Td 2014  - s/p shingrix - flu shot: Declined, benefits discussed  - Sees Gyn:  Saw gyn recently, had L breast calcifications on mammogram, early follow-up x-rays commended.  Patient aware. -DEXA done 2018 or 2019 per pt, no reports  --Cscope 11-05 + polyps, colonoscopy again 09/2009, normal. Refer to GI, interested on a f/u cscope  -- diet and exercise: Gaining weight earlier this year, doing no better. --Family history of lung cancer, patient is a former smoker.  Has some chest heaviness, she is quite concerned about this issue, we had a [place] discussion about proceeding with a low-dose CT scan for lung cancer screening, order sent, she is aware might not be correct insurance.  - labs CMP, FLP, CBC, A1c, UDS

## 2019-10-13 LAB — PAIN MGMT, PROFILE 8 W/CONF, U
6 Acetylmorphine: NEGATIVE ng/mL
Alcohol Metabolites: NEGATIVE ng/mL (ref <500)
Alphahydroxyalprazolam: NEGATIVE ng/mL
Alphahydroxymidazolam: NEGATIVE ng/mL
Alphahydroxytriazolam: NEGATIVE ng/mL
Aminoclonazepam: NEGATIVE ng/mL
Amphetamines: NEGATIVE ng/mL
Benzodiazepines: POSITIVE ng/mL
Buprenorphine, Urine: NEGATIVE ng/mL
Cocaine Metabolite: NEGATIVE ng/mL
Creatinine: 85.1 mg/dL
Hydroxyethylflurazepam: NEGATIVE ng/mL
Lorazepam: 254 ng/mL
MDMA: NEGATIVE ng/mL
Marijuana Metabolite: NEGATIVE ng/mL
Nordiazepam: NEGATIVE ng/mL
Opiates: NEGATIVE ng/mL
Oxazepam: NEGATIVE ng/mL
Oxidant: NEGATIVE ug/mL
Oxycodone: NEGATIVE ng/mL
Temazepam: NEGATIVE ng/mL
pH: 6.6 (ref 4.5–9.0)

## 2019-10-18 ENCOUNTER — Telehealth: Payer: Self-pay | Admitting: Gastroenterology

## 2019-10-18 NOTE — Telephone Encounter (Signed)
Hi Dr. Fuller Plan, we have received a referral from pt's PCP for a repeat colon. Pt had colon in 2015 at [facility]. She stated that she transferred care because [place] was not in network with her insurance at that time. However, she has a different insurance now and would like to transfer her GI care back to you. We have received her GI records. Could you please review them and advise on scheduling? Thank you.

## 2019-10-19 ENCOUNTER — Other Ambulatory Visit: Payer: Self-pay

## 2019-10-19 ENCOUNTER — Ambulatory Visit (HOSPITAL_BASED_OUTPATIENT_CLINIC_OR_DEPARTMENT_OTHER)
Admission: RE | Admit: 2019-10-19 | Discharge: 2019-10-19 | Disposition: A | Discharge status: Home / Self Care | Payer: BC Managed Care – PPO | Source: Ambulatory Visit | Attending: Internal Medicine | Admitting: Internal Medicine

## 2019-10-19 ENCOUNTER — Other Ambulatory Visit (HOSPITAL_BASED_OUTPATIENT_CLINIC_OR_DEPARTMENT_OTHER): Payer: Self-pay | Admitting: Internal Medicine

## 2019-10-19 DIAGNOSIS — R911 Solitary pulmonary nodule: Secondary | ICD-10-CM | POA: Diagnosis not present

## 2019-10-19 DIAGNOSIS — Z122 Encounter for screening for malignant neoplasm of respiratory organs: Secondary | ICD-10-CM | POA: Insufficient documentation

## 2019-10-21 ENCOUNTER — Encounter: Payer: Self-pay | Admitting: Internal Medicine

## 2019-10-24 NOTE — Telephone Encounter (Signed)
Dr. Fuller Plan reviewed records and okay to schedule direct colon and previsit.  Called and left message on Vmail to call back to schedule

## 2019-10-25 ENCOUNTER — Encounter: Payer: Self-pay | Admitting: Gastroenterology

## 2019-10-25 NOTE — Telephone Encounter (Signed)
Colon scheduled on 12/22 at [facility].

## 2019-11-16 ENCOUNTER — Ambulatory Visit (AMBULATORY_SURGERY_CENTER): Payer: BC Managed Care – PPO | Admitting: *Deleted

## 2019-11-16 ENCOUNTER — Other Ambulatory Visit: Payer: Self-pay

## 2019-11-16 VITALS — Temp 96.9°F | Ht 64.5 in | Wt 187.4 lb

## 2019-11-16 DIAGNOSIS — Z86010 Personal history of colonic polyps: Secondary | ICD-10-CM

## 2019-11-16 DIAGNOSIS — Z1159 Encounter for screening for other viral diseases: Secondary | ICD-10-CM

## 2019-11-16 MED ORDER — NA SULFATE-K SULFATE-MG SULF 17.5-3.13-1.6 GM/177ML PO SOLN: ORAL | 0 refills | Status: DC

## 2019-11-16 NOTE — Progress Notes (Signed)
Patient is here in-person for PV. Patient denies any allergies to eggs or soy. Patient denies any problems with anesthesia/sedation. Patient denies any oxygen use at home. Patient denies taking any diet/weight loss medications or blood thinners. Patient is not being treated for MRSA or C-diff.    COVID-19 screening test is on 12/17, the pt is aware. Pt is aware that care partner will wait in the car during procedure; if they feel like they will be too hot or cold to wait in the car; they may wait in the 4 th floor lobby. Patient is aware to bring only one care partner. We want them to wear a mask (we do not have any that we can provide them), practice social distancing, and we will check their temperatures when they get here.  I did remind the patient that their care partner needs to stay in the parking lot the entire time and have a cell phone available, we will call them when the pt is ready for discharge. Patient will wear mask into building.    Suprep $15 off coupon given to the patient.

## 2019-11-17 ENCOUNTER — Encounter: Payer: Self-pay | Admitting: Gastroenterology

## 2019-11-24 ENCOUNTER — Ambulatory Visit (INDEPENDENT_AMBULATORY_CARE_PROVIDER_SITE_OTHER): Payer: BC Managed Care – PPO

## 2019-11-24 ENCOUNTER — Other Ambulatory Visit: Payer: Self-pay | Admitting: Gastroenterology

## 2019-11-24 DIAGNOSIS — Z1159 Encounter for screening for other viral diseases: Secondary | ICD-10-CM

## 2019-11-24 LAB — SARS CORONAVIRUS 2 (TAT 6-24 HRS): SARS Coronavirus 2: NEGATIVE

## 2019-11-29 ENCOUNTER — Ambulatory Visit (AMBULATORY_SURGERY_CENTER): Payer: BC Managed Care – PPO | Admitting: Gastroenterology

## 2019-11-29 ENCOUNTER — Encounter: Payer: Self-pay | Admitting: Gastroenterology

## 2019-11-29 ENCOUNTER — Other Ambulatory Visit: Payer: Self-pay

## 2019-11-29 VITALS — BP 98/56 | HR 56 | Temp 97.8°F | Resp 9 | Ht 64.5 in | Wt 187.4 lb

## 2019-11-29 DIAGNOSIS — D123 Benign neoplasm of transverse colon: Secondary | ICD-10-CM | POA: Diagnosis not present

## 2019-11-29 DIAGNOSIS — Z86010 Personal history of colonic polyps: Secondary | ICD-10-CM

## 2019-11-29 DIAGNOSIS — Z1211 Encounter for screening for malignant neoplasm of colon: Secondary | ICD-10-CM | POA: Diagnosis not present

## 2019-11-29 MED ORDER — SODIUM CHLORIDE 0.9 % IV SOLN: 500.0000 mL | Freq: Once | INTRAVENOUS | Status: DC

## 2019-11-29 NOTE — Progress Notes (Signed)
PT taken to PACU. Monitors in place. VSS. Report given to RN. 

## 2019-11-29 NOTE — Op Note (Signed)
[place] Patient Name: Adrienne Burke Procedure Date: 11/29/2019 8:33 AM MRN: [id number] Endoscopist: Ladene Artist , MD Age: 59 Referring MD:  Date of Birth: [birth year] Gender: Female Account #: [id number] Procedure:                Colonoscopy Indications:              Surveillance: Personal history of adenomatous                            polyps on last colonoscopy 5 years ago Medicines:                Monitored Anesthesia Care Procedure:                Pre-Anesthesia Assessment:                           - Prior to the procedure, a History and Physical                            was performed, and patient medications and                            allergies were reviewed. The patient's tolerance of                            previous anesthesia was also reviewed. The risks                            and benefits of the procedure and the sedation                            options and risks were discussed with the patient.                            All questions were answered, and informed consent                            was obtained. Prior Anticoagulants: The patient has                            taken no previous anticoagulant or antiplatelet                            agents. ASA Grade Assessment: II - A patient with                            mild systemic disease. After reviewing the risks                            and benefits, the patient was deemed in                            satisfactory condition to undergo the procedure.  After obtaining informed consent, the colonoscope                            was passed under direct vision. Throughout the                            procedure, the patient's blood pressure, pulse, and                            oxygen saturations were monitored continuously. The                            Colonoscope was introduced through the anus and                            advanced to the the cecum,  identified by                            appendiceal orifice and ileocecal valve. The                            ileocecal valve, appendiceal orifice, and rectum                            were photographed. The quality of the bowel                            preparation was good after extensive lavage and                            suctioning. The colonoscopy was performed without                            difficulty. The patient tolerated the procedure                            well. Scope In: 8:40:39 AM Scope Out: 9:07:28 AM Scope Withdrawal Time: 0 hours 21 minutes 56 seconds  Total Procedure Duration: 0 hours 26 minutes 49 seconds  Findings:                 The perianal and digital rectal examinations were                            normal.                           A 6 mm polyp was found in the hepatic flexure. The                            polyp was sessile. The polyp was removed with a                            cold snare. Resection and retrieval were complete.  A few small-mouthed diverticula were found in the                            left colon. There was no evidence of diverticular                            bleeding.                           Internal hemorrhoids were found during                            retroflexion. The hemorrhoids were small and Grade                            I (internal hemorrhoids that do not prolapse).                           The exam was otherwise without abnormality on                            direct and retroflexion views. Complications:            No immediate complications. Estimated blood loss:                            None. Estimated Blood Loss:     Estimated blood loss: none. Impression:               - One 6 mm polyp at the hepatic flexure, removed                            with a cold snare. Resected and retrieved.                           - Mild diverticulosis in the left colon.                            - Internal hemorrhoids.                           - The examination was otherwise normal on direct                            and retroflexion views. Recommendation:           - Repeat colonoscopy after studies are complete for                            surveillance based on pathology results with a more                            extensive bowel prep.                           - Patient has a contact number available for  emergencies. The signs and symptoms of potential                            delayed complications were discussed with the                            patient. Return to normal activities tomorrow.                            Written discharge instructions were provided to the                            patient.                           - High fiber diet.                           - Continue present medications.                           - Await pathology results. Ladene Artist, MD 11/29/2019 9:11:33 AM This report has been signed electronically.

## 2019-11-29 NOTE — Patient Instructions (Signed)
HANDOUTS PROVIDED ON: HIGH FIBER DIET, POLYPS, DIVERTICULOSIS, & HEMORRHOIDS  The polyp removed taken today have been sent for pathology.  The results can take 1-3 weeks to receive.  When your next colonoscopy should occur will be based on the pathology results.    You may resume your previous diet and medication schedule.  Thank you for allowing [place] to care for you today!!!  YOU HAD AN ENDOSCOPIC PROCEDURE TODAY AT [place]:   Refer to the procedure report that was given to you for any specific questions about what was found during the examination.  If the procedure report does not answer your questions, please call your gastroenterologist to clarify.  If you requested that your care partner not be given the details of your procedure findings, then the procedure report has been included in a sealed envelope for you to review at your convenience later.  YOU SHOULD EXPECT: Some feelings of bloating in the abdomen. Passage of more gas than usual.  Walking can help get rid of the air that was put into your GI tract during the procedure and reduce the bloating. If you had a lower endoscopy (such as a colonoscopy or flexible sigmoidoscopy) you may notice spotting of blood in your stool or on the toilet paper. If you underwent a bowel prep for your procedure, you may not have a normal bowel movement for a few days.  Please Note:  You might notice some irritation and congestion in your nose or some drainage.  This is from the oxygen used during your procedure.  There is no need for concern and it should clear up in a day or so.  SYMPTOMS TO REPORT IMMEDIATELY:   Following lower endoscopy (colonoscopy or flexible sigmoidoscopy):  Excessive amounts of blood in the stool  Significant tenderness or worsening of abdominal pains  Swelling of the abdomen that is new, acute  Fever of 100F or higher  For urgent or emergent issues, a gastroenterologist can be reached at any hour by calling  [phone].   DIET:  We do recommend a small meal at first, but then you may proceed to your regular diet.  Drink plenty of fluids but you should avoid alcoholic beverages for 24 hours.  ACTIVITY:  You should plan to take it easy for the rest of today and you should NOT DRIVE or use heavy machinery until tomorrow (because of the sedation medicines used during the test).    FOLLOW UP: Our staff will call the number listed on your records 48-72 hours following your procedure to check on you and address any questions or concerns that you may have regarding the information given to you following your procedure. If we do not reach you, we will leave a message.  We will attempt to reach you two times.  During this call, we will ask if you have developed any symptoms of COVID 19. If you develop any symptoms (ie: fever, flu-like symptoms, shortness of breath, cough etc.) before then, please call [phone].  If you test positive for Covid 19 in the 2 weeks post procedure, please call and report this information to [place].    If any biopsies were taken you will be contacted by phone or by letter within the next 1-3 weeks.  Please call us at [phone] if you have not heard about the biopsies in 3 weeks.    SIGNATURES/CONFIDENTIALITY: You and/or your care partner have signed paperwork which will be entered into your electronic medical  record.  These signatures attest to the fact that that the information above on your After Visit Summary has been reviewed and is understood.  Full responsibility of the confidentiality of this discharge information lies with you and/or your care-partner.

## 2019-11-29 NOTE — Progress Notes (Signed)
Temp by JB, VS by CW  Pt's states no medical or surgical changes since previsit or office visit.

## 2019-11-30 ENCOUNTER — Other Ambulatory Visit: Payer: Self-pay | Admitting: Internal Medicine

## 2019-12-01 ENCOUNTER — Telehealth: Payer: Self-pay | Admitting: *Deleted

## 2019-12-01 NOTE — Telephone Encounter (Signed)
  Follow up Call-  Call back number 11/29/2019  Post procedure Call Back phone  # [phone]  Permission to leave phone message Yes  Some recent data might be hidden     Patient questions:  Do you have a fever, pain , or abdominal swelling? No. Pain Score  0 *  Have you tolerated food without any problems? Yes.    Have you been able to return to your normal activities? Yes.    Do you have any questions about your discharge instructions: Diet   No. Medications  No. Follow up visit  No.  Do you have questions or concerns about your Care? No.  Actions: * If pain score is 4 or above: No action needed, pain <4.  1. Have you developed a fever since your procedure? no  2.   Have you had an respiratory symptoms (SOB or cough) since your procedure? no  3.   Have you tested positive for COVID 19 since your procedure no  4.   Have you had any family members/close contacts diagnosed with the COVID 19 since your procedure?  no   If yes to any of these questions please route to Joylene John, RN and Alphonsa Gin, Therapist, sports.

## 2019-12-07 ENCOUNTER — Encounter: Payer: Self-pay | Admitting: Gastroenterology

## 2019-12-15 DIAGNOSIS — Z03818 Encounter for observation for suspected exposure to other biological agents ruled out: Secondary | ICD-10-CM | POA: Diagnosis not present

## 2019-12-15 DIAGNOSIS — Z20828 Contact with and (suspected) exposure to other viral communicable diseases: Secondary | ICD-10-CM | POA: Diagnosis not present

## 2019-12-17 DIAGNOSIS — R05 Cough: Secondary | ICD-10-CM | POA: Diagnosis not present

## 2019-12-21 ENCOUNTER — Other Ambulatory Visit: Payer: Self-pay | Admitting: Internal Medicine

## 2020-03-22 ENCOUNTER — Other Ambulatory Visit: Payer: Self-pay

## 2020-03-22 ENCOUNTER — Ambulatory Visit
Admission: RE | Admit: 2020-03-22 | Discharge: 2020-03-22 | Disposition: A | Discharge status: Home / Self Care | Payer: BC Managed Care – PPO | Source: Ambulatory Visit | Attending: Obstetrics and Gynecology | Admitting: Obstetrics and Gynecology

## 2020-03-22 ENCOUNTER — Other Ambulatory Visit: Payer: Self-pay | Admitting: Obstetrics and Gynecology

## 2020-03-22 DIAGNOSIS — R921 Mammographic calcification found on diagnostic imaging of breast: Secondary | ICD-10-CM

## 2020-05-03 ENCOUNTER — Encounter: Payer: Self-pay | Admitting: Family Medicine

## 2020-05-03 ENCOUNTER — Ambulatory Visit (INDEPENDENT_AMBULATORY_CARE_PROVIDER_SITE_OTHER): Payer: BC Managed Care – PPO | Admitting: Family Medicine

## 2020-05-03 ENCOUNTER — Other Ambulatory Visit: Payer: Self-pay

## 2020-05-03 VITALS — BP 108/68 | HR 72 | Temp 97.2°F | Resp 18 | Ht 64.5 in | Wt 187.4 lb

## 2020-05-03 DIAGNOSIS — L237 Allergic contact dermatitis due to plants, except food: Secondary | ICD-10-CM

## 2020-05-03 MED ORDER — PREDNISONE 10 MG PO TABS: ORAL_TABLET | ORAL | 0 refills | Status: DC

## 2020-05-03 MED ORDER — METHYLPREDNISOLONE ACETATE 80 MG/ML IJ SUSP
80.0000 mg | Freq: Once | INTRAMUSCULAR | Status: AC
  Administered 2020-05-03: 80 mg via INTRAMUSCULAR

## 2020-05-03 NOTE — Progress Notes (Signed)
Patient ID: Adrienne Burke, female    DOB: September 15, 1961  Age: 59 y.o. MRN: [id number]    Subjective:  Subjective  HPI Adrienne Burke presents for ? Poison ivy x few weeks -- she is using calamine lotion but it is still spreading    It is very itchy  Review of Systems  Constitutional: Negative for appetite change, diaphoresis, fatigue and unexpected weight change.  Eyes: Negative for pain, redness and visual disturbance.  Respiratory: Negative for cough, chest tightness, shortness of breath and wheezing.   Cardiovascular: Negative for chest pain, palpitations and leg swelling.  Endocrine: Negative for cold intolerance, heat intolerance, polydipsia, polyphagia and polyuria.  Genitourinary: Negative for difficulty urinating, dysuria and frequency.  Neurological: Negative for dizziness, light-headedness, numbness and headaches.    History Past Medical History:  Diagnosis Date  . Actinic keratitis    hx of  . Anxiety and depression   . Atrophic vaginitis   . Colon polyp    Hyperplastic polyps 10/2004, Normal Cscope 2010   . ENDOMETRIOSIS   . Genital HSV   . HYPERLIPIDEMIA   . Insomnia   . Ovarian cyst    bilateral, s/p drainage    She has a past surgical history that includes Appendectomy; Breast implant exchange; Ankle fracture surgery; uterine ablation; Eye surgery; Tubal ligation; Tonsillectomy and adenoidectomy (1966); Colonoscopy [id number]); and Polypectomy (2015).   Adrienne Burke family history includes COPD in Adrienne Burke mother; Cancer - Lung in Adrienne Burke father and mother; Colon cancer (age of onset: 17) in Adrienne Burke paternal grandfather; Heart disease in Adrienne Burke maternal grandfather, maternal grandmother, paternal grandfather, and paternal grandmother; Skin cancer in Adrienne Burke mother.She reports that she quit smoking about 25 years ago. She started smoking about 41 years ago. She has a 7.50 pack-year smoking history. She has never used smokeless tobacco. She reports current alcohol use of about 1.0 standard drinks of  alcohol per week. She reports that she does not use drugs.  Current Outpatient Medications on File Prior to Visit  Medication Sig Dispense Refill  . atorvastatin (LIPITOR) 10 MG tablet Take 1 tablet (10 mg total) by mouth daily. 30 tablet 3  . LORazepam (ATIVAN) 0.5 MG tablet TAKE 1 TABLET (0.5 MG TOTAL) BY MOUTH AT BEDTIME AS NEEDED FOR ANXIETY. 30 tablet 5  . Multiple Vitamins-Minerals (MULTIVITAMIN,TX-MINERALS) tablet Take 1 tablet by mouth daily.      . sertraline (ZOLOFT) 50 MG tablet Take 1 tablet (50 mg total) by mouth daily. 90 tablet 3   No current facility-administered medications on file prior to visit.     Objective:  Objective  Physical Exam Vitals and nursing note reviewed.  Constitutional:      Appearance: She is well-developed.  HENT:     Head: Normocephalic and atraumatic.  Eyes:     Conjunctiva/sclera: Conjunctivae normal.  Neck:     Thyroid: No thyromegaly.     Vascular: No carotid bruit or JVD.  Cardiovascular:     Rate and Rhythm: Normal rate and regular rhythm.     Heart sounds: Normal heart sounds. No murmur.  Pulmonary:     Effort: Pulmonary effort is normal. No respiratory distress.     Breath sounds: Normal breath sounds. No wheezing or rales.  Chest:     Chest wall: No tenderness.  Musculoskeletal:     Cervical back: Normal range of motion and neck supple.  Neurological:     Mental Status: She is alert and oriented to person, place, and time.    BP  108/68 (BP Location: Right Arm, Patient Position: Sitting, Cuff Size: Normal)   Pulse 72   Temp (!) 97.2 F (36.2 C) (Temporal)   Resp 18   Ht 5' 4.5" (1.638 m)   Wt 187 lb 6.4 oz (85 kg)   SpO2 96%   BMI 31.67 kg/m  Wt Readings from Last 3 Encounters:  05/03/20 187 lb 6.4 oz (85 kg)  11/29/19 187 lb 6.4 oz (85 kg)  11/16/19 187 lb 6.4 oz (85 kg)     Lab Results  Component Value Date   WBC 5.4 10/11/2019   HGB 12.8 10/11/2019   HCT 38.4 10/11/2019   PLT 261.0 10/11/2019   GLUCOSE 101  (H) 10/11/2019   CHOL 197 10/11/2019   TRIG 100.0 10/11/2019   HDL 56.00 10/11/2019   LDLDIRECT 164.0 07/16/2011   LDLCALC 121 (H) 10/11/2019   ALT 7 10/11/2019   AST 16 10/11/2019   NA 138 10/11/2019   K 4.3 10/11/2019   CL 104 10/11/2019   CREATININE 0.76 10/11/2019   BUN 11 10/11/2019   CO2 27 10/11/2019   TSH 2.63 06/04/2018   HGBA1C 5.9 10/11/2019    MM DIAG BREAST W/IMPLANT TOMO BILATERAL  Result Date: 03/22/2020 CLINICAL DATA:  Follow-up left breast probably benign calcifications. The patient had a COVID-19 vaccine 3 weeks ago. EXAM: 2D DIGITAL DIAGNOSTIC BILATERAL MAMMOGRAM WITH IMPLANTS, CAD AND ADJUNCT TOMO The patient has retropectoral implants. Standard and implant displaced views were performed. COMPARISON:  Previous exam(s). ACR Breast Density Category c: The breast tissue is heterogeneously dense, which may obscure small masses. FINDINGS: There are slightly more coarse, dystrophic appearing calcifications in the previously demonstrated 5 mm group calcifications in the upper inner quadrant of the left breast. No interval findings suspicious for malignancy in either breast. Mammographic images were processed with CAD. IMPRESSION: 1. Slightly progressive coarse calcifications in the upper inner quadrant of the left breast, most likely representing calcifications associated with a small degenerated fibroadenoma. 2. No evidence of malignancy elsewhere in either breast. RECOMMENDATION: Left diagnostic mammogram with magnification views in 6 months. That will be 1 year since initial mammographic evaluation of the probably benign left breast calcifications. I have discussed the findings and recommendations with the patient. If applicable, a reminder letter will be sent to the patient regarding the next appointment. BI-RADS CATEGORY  3: Probably benign. Electronically Signed   By: Claudie Revering M.D.   On: 03/22/2020 12:02     Assessment & Plan:  Plan  I am having Adrienne Burke. Zagami start on  predniSONE. I am also having Adrienne Burke maintain Adrienne Burke (multivitamin,tx-minerals), atorvastatin, sertraline, and LORazepam. We administered methylPREDNISolone acetate.  Meds ordered this encounter  Medications  . predniSONE (DELTASONE) 10 MG tablet    Sig: TAKE 3 TABLETS PO QD FOR 3 DAYS THEN TAKE 2 TABLETS PO QD FOR 3 DAYS THEN TAKE 1 TABLET PO QD FOR 3 DAYS THEN TAKE 1/2 TAB PO QD FOR 3 DAYS    Dispense:  20 tablet    Refill:  0  . methylPREDNISolone acetate (DEPO-MEDROL) injection 80 mg    Problem List Items Addressed This Visit    None    Visit Diagnoses    Allergic contact dermatitis due to plants, except food    -  Primary   Relevant Medications   predniSONE (DELTASONE) 10 MG tablet   methylPREDNISolone acetate (DEPO-MEDROL) injection 80 mg (Completed)    benadryl prn for itcing  rto prn   Follow-up: Return if symptoms worsen  or fail to improve.  Ann Held, DO

## 2020-05-03 NOTE — Patient Instructions (Signed)
Poison Ivy Dermatitis Poison ivy dermatitis is inflammation of the skin that is caused by chemicals in the leaves of the poison ivy plant. The skin reaction often involves redness, swelling, blisters, and extreme itching. What are the causes? This condition is caused by a chemical (urushiol) found in the sap of the poison ivy plant. This chemical is sticky and can be easily spread to people, animals, and objects. You can get poison ivy dermatitis by:  Having direct contact with a poison ivy plant.  Touching animals, other people, or objects that have come in contact with poison ivy and have the chemical on them. What increases the risk? This condition is more likely to develop in people who:  Are outdoors often in wooded or marshy areas.  Go outdoors without wearing protective clothing, such as closed shoes, Garcia pants, and a Banks-sleeved shirt. What are the signs or symptoms? Symptoms of this condition include:  Redness of the skin.  Extreme itching.  A rash that often includes bumps and blisters. The rash usually appears 48 hours after exposure, if you have been exposed before. If this is the first time you have been exposed, the rash may not appear until a week after exposure.  Swelling. This may occur if the reaction is more severe. Symptoms usually last for 1-2 weeks. However, the first time you develop this condition, symptoms may last 3-4 weeks. How is this diagnosed? This condition may be diagnosed based on your symptoms and a physical exam. Your health care provider may also ask you about any recent outdoor activity. How is this treated? Treatment for this condition will vary depending on how severe it is. Treatment may include:  Hydrocortisone cream or calamine lotion to relieve itching.  Oatmeal baths to soothe the skin.  Medicines, such as over-the-counter antihistamine tablets.  Oral steroid medicine, for more severe reactions. Follow these instructions at  home: Medicines  Take or apply over-the-counter and prescription medicines only as told by your health care provider.  Use hydrocortisone cream or calamine lotion as needed to soothe the skin and relieve itching. General instructions  Do not scratch or rub your skin.  Apply a cold, wet cloth (cold compress) to the affected areas or take baths in cool water. This will help with itching. Avoid hot baths and showers.  Take oatmeal baths as needed. Use colloidal oatmeal. You can get this at your local pharmacy or grocery store. Follow the instructions on the packaging.  While you have the rash, wash clothes right after you wear them.  Keep all follow-up visits as told by your health care provider. This is important. How is this prevented?   Learn to identify the poison ivy plant and avoid contact with the plant. This plant can be recognized by the number of leaves. Generally, poison ivy has three leaves with flowering branches on a single stem. The leaves are typically glossy, and they have jagged edges that come to a point at the front.  If you have been exposed to poison ivy, thoroughly wash with soap and water right away. You have about 30 minutes to remove the plant resin before it will cause the rash. Be sure to wash under your fingernails, because any plant resin there will continue to spread the rash.  When hiking or camping, wear clothes that will help you to avoid exposure on the skin. This includes Darco pants, a Thull-sleeved shirt, tall socks, and hiking boots. You can also apply preventive lotion to your skin to   help limit exposure.  If you suspect that your clothes or outdoor gear came in contact with poison ivy, rinse them off outside with a garden hose before you bring them inside your house.  When doing yard work or gardening, wear gloves, Pavao sleeves, Butsch pants, and boots. Wash your garden tools and gloves if they come in contact with poison ivy.  If you suspect that your  pet has come into contact with poison ivy, wash him or her with pet shampoo and water. Make sure to wear gloves while washing your pet. Contact a health care provider if you have:  Open sores in the rash area.  More redness, swelling, or pain in the affected area.  Redness that spreads beyond the rash area.  Fluid, blood, or pus coming from the affected area.  A fever.  A rash over a large area of your body.  A rash on your eyes, mouth, or genitals.  A rash that does not improve after a few weeks. Get help right away if:  Your face swells or your eyes swell shut.  You have trouble breathing.  You have trouble swallowing. These symptoms may represent a serious problem that is an emergency. Do not wait to see if the symptoms will go away. Get medical help right away. Call your local emergency services (911 in the [place] Do not drive yourself to the hospital. Summary  Poison ivy dermatitis is inflammation of the skin that is caused by chemicals in the leaves of the poison ivy plant.  Symptoms of this condition include redness, itching, a rash, and swelling.  Do not scratch or rub your skin.  Take or apply over-the-counter and prescription medicines only as told by your health care provider. This information is not intended to replace advice given to you by your health care provider. Make sure you discuss any questions you have with your health care provider. Document Revised: 03/18/2019 Document Reviewed: 11/19/2018 Elsevier Patient Education  2020 [employer] Inc.  

## 2020-05-04 ENCOUNTER — Other Ambulatory Visit: Payer: Self-pay | Admitting: Internal Medicine

## 2020-05-04 ENCOUNTER — Ambulatory Visit (INDEPENDENT_AMBULATORY_CARE_PROVIDER_SITE_OTHER): Payer: BC Managed Care – PPO | Admitting: Family Medicine

## 2020-05-04 ENCOUNTER — Encounter: Payer: Self-pay | Admitting: Family Medicine

## 2020-05-04 ENCOUNTER — Ambulatory Visit: Payer: Self-pay

## 2020-05-04 VITALS — BP 118/80 | HR 69 | Ht 64.5 in | Wt 188.8 lb

## 2020-05-04 DIAGNOSIS — S86112A Strain of other muscle(s) and tendon(s) of posterior muscle group at lower leg level, left leg, initial encounter: Secondary | ICD-10-CM | POA: Diagnosis not present

## 2020-05-04 DIAGNOSIS — M79605 Pain in left leg: Secondary | ICD-10-CM

## 2020-05-04 NOTE — Patient Instructions (Signed)
Thank you for coming in today. You have a medial head of the gastrocnemius tear AKA tennis leg.  Use body helix full calf sleeve.  Use over the counter voltaren gel.  Start the exercises.  Go from up to down slowly.  10-30 reps 2-3x daily.  Ok to resume activity as tolerated.   I recommend you obtained a compression sleeve to help with your joint problems. There are many options on the market however I recommend obtaining a Calf Body Helix compression sleeve.  You can find information (including how to appropriate measure yourself for sizing) can be found at www.Body http://www.lambert.com/.  Many of these products are health savings account (HSA) eligible.   You can use the compression sleeve at any time throughout the day but is most important to use while being active as well as for 2 hours post-activity.   It is appropriate to ice following activity with the compression sleeve in place.     Medial Head Gastrocnemius Tear Rehab Ask your health care provider which exercises are safe for you. Do exercises exactly as told by your health care provider and adjust them as directed. It is normal to feel mild stretching, pulling, tightness, or discomfort as you do these exercises. Stop right away if you feel sudden pain or your pain gets worse. Do not begin these exercises until told by your health care provider. Stretching and range-of-motion exercises These exercises warm up your muscles and joints and improve the movement and flexibility of your lower leg. These exercises also help to relieve pain and stiffness. Gastrocnemius stretch This exercise is also called a calf stretch. It stretches the muscles in the back of the lower leg (gastrocnemius). 1. Sit with your left / right leg extended. 2. Loop a belt or towel around the ball of your left / right foot. The ball of your foot is on the walking surface, right under your toes. 3. Hold both ends of the belt or towel. 4. Keep your left / right ankle and foot  relaxed and keep your knee straight while you use the belt or towel to pull your foot and ankle toward you. Stop at the first point of resistance. 5. Hold this position for __________ seconds. Repeat __________ times. Complete this exercise __________ times a day. Ankle alphabet  1. Sit with your left / right leg supported at the lower leg. ? Do not rest your foot on anything. ? Make sure your foot has room to move freely. 2. Think of your left / right foot as a paintbrush. ? Move your foot to trace each letter of the alphabet in the air. Keep your hip and knee still while you trace. ? Make the letters as large as you can without feeling discomfort. 3. Trace every letter of the alphabet. Repeat __________ times. Complete this exercise __________ times a day. Strengthening exercises These exercises build strength and endurance in your lower leg. Endurance is the ability to use your muscles for a Stipp time, even after they get tired. Plantar flexion with band, seated  1. Sit on the floor with your left / right leg extended. 2. Loop a rubber exercise band or tube around the ball of your left / right foot. The ball of your foot is on the walking surface, right under your toes. The band or tube should be slightly tense when your foot is relaxed. If the band or tube slips, you can put on your shoe or put a washcloth between the band  and your foot to help it stay in place. 3. While holding both ends of the band or tube, slowly point your toes downward, pushing them away from you (plantar flexion). 4. Hold this position for __________ seconds. 5. Slowly release the tension in the band or tube, controlling smoothly until your foot is back to the starting position. Repeat __________ times. Complete this exercise __________ times a day. Plantar flexion, standing  1. Stand with your feet shoulder-width apart. 2. Place your hands on a wall or table to steady yourself as needed, but try not to use it for  support. 3. Rise up on your toes (plantar flexion). 4. If this exercise is too easy, try these options: ? Shift your weight toward your left / right leg until you feel challenged. ? If told by your health care provider, stand on your left / right foot only. 5. Hold this position for __________ seconds. Repeat __________ times. Complete this exercise __________ times a day. Eccentric plantar flexion  1. Stand on the balls of your feet on the edge of a step. The ball of your foot is on the walking surface, right under your toes. ? Do not put your heels on the step. ? For balance, rest your hands on the wall or on a railing. Try not to lean on it for support. 2. Rise up onto the balls of your feet, using both legs to help. 3. Keeping your heels up, slowly shift all of your weight to your left / right foot and lift your other foot off the step. 4. Slowly lower your left / right heel so it drops below the level of the step. Lowering your heel under tension is called eccentric plantar flexion. You will feel a slight stretch in your left / right calf. 5. Put your other foot back onto the step before returning to the start position. Repeat __________ times. Complete this exercise __________ times a day. This information is not intended to replace advice given to you by your health care provider. Make sure you discuss any questions you have with your health care provider. Document Revised: 09/21/2019 Document Reviewed: 10/04/2018 Elsevier Patient Education  2020 [employer].

## 2020-05-04 NOTE — Progress Notes (Signed)
   I, Adrienne Burke, LAT, ATC, am serving as scribe for Dr. Lynne Leader.  Karen Chafe. [name] is a 59 y.o. female who presents to [place] at [facility] today for L lower leg/calf pain after injuring herself last Friday when she was helping to move some furniture.  She was last seen by Dr. Tamala Julian on 06/27/19 for her R shoulder.  Today, she reports L calf pain that worsened since Tuesday when she was moving to the L while playing pickleball.  She locates her pain more to her L medial calf.  Radiating pain: yes in the L posterior lower leg Aggravating factors: walking; L ankle DF Treatments tried: ice, Advil   Pertinent review of systems: No fevers or chills  Relevant historical information: Hyperlipidemia   Exam:  BP 118/80 (BP Location: Left Arm, Patient Position: Sitting, Cuff Size: Normal)   Pulse 69   Ht 5' 4.5" (1.638 m)   Wt 188 lb 12.8 oz (85.6 kg)   SpO2 96%   BMI 31.91 kg/m  General: Well Developed, well nourished, and in no acute distress.   MSK: Left calf bruising present at medial head gastrocnemius muscle tendinous junction otherwise normal-appearing Mildly tender to palpation at muscle tendinous junction medial gastrocnemius.  Normal ankle and knee motion.  Normal strength. Pulses cap refill and sensation are intact distally.    Lab and Radiology Results Diagnostic Limited MSK Ultrasound of: Left calf medial gastrocnemius Intact muscle tendinous junction with hypoechoic fluid tracking within muscle fibers consistent with partial tear and hematoma.  No significant full-thickness tear visible. Impression: Medial head gastrocnemius small tear/strain     Assessment and Plan: 59 y.o. female with medial gastrocnemius muscle tear.  Relatively mild.  Plan to treat with compressive sleeve Voltaren gel and home exercise program.  Taught eccentric exercises today.  Advance activity as tolerated and recheck as needed.   PDMP not reviewed this encounter. Orders  Placed This Encounter  Procedures  . [place] LIMITED JOINT SPACE STRUCTURES LOW LEFT(NO LINKED CHARGES)    Order Specific Question:   Reason for Exam (SYMPTOM  OR DIAGNOSIS REQUIRED)    Answer:   left calf    Order Specific Question:   Preferred imaging location?    Answer:   [place]   No orders of the defined types were placed in this encounter.    Discussed warning signs or symptoms. Please see discharge instructions. Patient expresses understanding.   The above documentation has been reviewed and is accurate and complete Lynne Leader, M.D.

## 2020-05-12 DIAGNOSIS — L237 Allergic contact dermatitis due to plants, except food: Secondary | ICD-10-CM | POA: Diagnosis not present

## 2020-06-10 ENCOUNTER — Telehealth: Payer: Self-pay | Admitting: Internal Medicine

## 2020-06-12 NOTE — Telephone Encounter (Signed)
PDMP okay, Rx sent 

## 2020-06-12 NOTE — Telephone Encounter (Signed)
Lorazepam refill.   Last OV: 10/10/2019 Last Fill: 12/21/2019 #30 and 5RF Pt sig: 1 tab qhs prn UDS:  10/11/2019 Low risk

## 2020-08-26 DIAGNOSIS — Z20822 Contact with and (suspected) exposure to covid-19: Secondary | ICD-10-CM | POA: Diagnosis not present

## 2020-09-26 ENCOUNTER — Ambulatory Visit
Admission: RE | Admit: 2020-09-26 | Discharge: 2020-09-26 | Disposition: A | Discharge status: Home / Self Care | Payer: BC Managed Care – PPO | Source: Ambulatory Visit | Attending: Obstetrics and Gynecology | Admitting: Obstetrics and Gynecology

## 2020-09-26 ENCOUNTER — Other Ambulatory Visit: Payer: Self-pay

## 2020-09-26 ENCOUNTER — Other Ambulatory Visit: Payer: Self-pay | Admitting: Obstetrics and Gynecology

## 2020-09-26 DIAGNOSIS — R921 Mammographic calcification found on diagnostic imaging of breast: Secondary | ICD-10-CM | POA: Diagnosis not present

## 2020-10-10 ENCOUNTER — Other Ambulatory Visit: Payer: Self-pay

## 2020-10-10 ENCOUNTER — Ambulatory Visit (INDEPENDENT_AMBULATORY_CARE_PROVIDER_SITE_OTHER): Payer: BC Managed Care – PPO | Admitting: Internal Medicine

## 2020-10-10 ENCOUNTER — Encounter: Payer: Self-pay | Admitting: Internal Medicine

## 2020-10-10 VITALS — BP 118/72 | HR 57 | Temp 97.8°F | Resp 16 | Ht 65.0 in | Wt 186.2 lb

## 2020-10-10 DIAGNOSIS — L814 Other melanin hyperpigmentation: Secondary | ICD-10-CM | POA: Diagnosis not present

## 2020-10-10 DIAGNOSIS — Z0000 Encounter for general adult medical examination without abnormal findings: Secondary | ICD-10-CM | POA: Diagnosis not present

## 2020-10-10 DIAGNOSIS — L219 Seborrheic dermatitis, unspecified: Secondary | ICD-10-CM | POA: Diagnosis not present

## 2020-10-10 DIAGNOSIS — F419 Anxiety disorder, unspecified: Secondary | ICD-10-CM | POA: Diagnosis not present

## 2020-10-10 DIAGNOSIS — E785 Hyperlipidemia, unspecified: Secondary | ICD-10-CM | POA: Diagnosis not present

## 2020-10-10 DIAGNOSIS — F32A Depression, unspecified: Secondary | ICD-10-CM

## 2020-10-10 DIAGNOSIS — Z79899 Other long term (current) drug therapy: Secondary | ICD-10-CM | POA: Diagnosis not present

## 2020-10-10 DIAGNOSIS — L821 Other seborrheic keratosis: Secondary | ICD-10-CM | POA: Diagnosis not present

## 2020-10-10 DIAGNOSIS — Z23 Encounter for immunization: Secondary | ICD-10-CM

## 2020-10-10 DIAGNOSIS — R911 Solitary pulmonary nodule: Secondary | ICD-10-CM

## 2020-10-10 NOTE — Progress Notes (Signed)
Subjective:    Patient ID: Adrienne Burke, female    DOB: June 26, 1961, 59 y.o.   MRN: [id number]  DOS:  10/10/2020 Type of visit - description: CPX In general feels well. She is somewhat frustrated about not being able to lose weight despite trying different things including the keto diet and some probiotics. Has not been able to exercise much due to lack of time.  Wt Readings from Last 3 Encounters:  10/10/20 186 lb 4 oz (84.5 kg)  05/04/20 188 lb 12.8 oz (85.6 kg)  05/03/20 187 lb 6.4 oz (85 kg)     Review of Systems  Other than above, a 14 point review of systems is negative      Past Medical History:  Diagnosis Date  . Actinic keratitis    hx of  . Anxiety and depression   . Atrophic vaginitis   . Colon polyp    Hyperplastic polyps 10/2004, Normal Cscope 2010   . ENDOMETRIOSIS   . Genital HSV   . HYPERLIPIDEMIA   . Insomnia   . Ovarian cyst    bilateral, s/p drainage    Past Surgical History:  Procedure Laterality Date  . ANKLE FRACTURE SURGERY    . APPENDECTOMY    . BREAST IMPLANT EXCHANGE     breast lift-implant 2009, repeated 08-2009    . COLONOSCOPY  [id number]  . EYE SURGERY     [place] for vision correction 02-2012  . POLYPECTOMY  2015   adenoma polyp  . TONSILLECTOMY AND ADENOIDECTOMY  1966  . TUBAL LIGATION    . uterine ablation      Allergies as of 10/10/2020      Reactions   Codeine Phosphate    REACTION: unspecified   Penicillins    REACTION: unspecified   Sulfonamide Derivatives    REACTION: unspecified      Medication List       Accurate as of October 10, 2020 11:59 PM. If you have any questions, ask your nurse or doctor.        STOP taking these medications   predniSONE 10 MG tablet Commonly known as: DELTASONE Stopped by: Kathlene November, MD     TAKE these medications   atorvastatin 10 MG tablet Commonly known as: LIPITOR Take 1 tablet (10 mg total) by mouth daily.   LORazepam 0.5 MG tablet Commonly known as: ATIVAN Take 1  tablet (0.5 mg total) by mouth at bedtime as needed for anxiety.   multivitamin,tx-minerals tablet Take 1 tablet by mouth daily.   sertraline 50 MG tablet Commonly known as: ZOLOFT Take 1 tablet (50 mg total) by mouth daily.          Objective:   Physical Exam BP 118/72 (BP Location: Right Arm, Patient Position: Sitting, Cuff Size: Small)   Pulse (!) 57   Temp 97.8 F (36.6 C) (Oral)   Resp 16   Ht 5\' 5"  (1.651 m)   Wt 186 lb 4 oz (84.5 kg)   SpO2 98%   BMI 30.99 kg/m  General: Well developed, NAD, BMI noted Neck: No  thyromegaly  HEENT:  Normocephalic . Face symmetric, atraumatic Lungs:  CTA B Normal respiratory effort, no intercostal retractions, no accessory muscle use. Heart: RRR,  no murmur.  Abdomen:  Not distended, soft, non-tender. No rebound or rigidity.   Lower extremities: no pretibial edema bilaterally  Skin: Exposed areas without rash. Not pale. Not jaundice Neurologic:  alert & oriented X3.  Speech normal, gait appropriate for age  and unassisted Strength symmetric and appropriate for age.  Psych: Cognition and judgment appear intact.  Cooperative with normal attention span and concentration.  Behavior appropriate. No anxious or depressed appearing.     Assessment    Assessment  Hyperglycemia A1c 6.0 01-2018 Hyperlipidemia Anxiety, depression GYN: Endometriosis. s/p exploration , DUB, endometrial ablasion H/o actinic keratosis, sees derm q year as of 05/2018   PLAN Here for CPX Hyperglycemia: Checking labs High cholesterol: On Lipitor, checking labs Anxiety depression: She also has some insomnia, symptoms controlled on Zoloft, Ativan.  Still has occasional difficulty sleeping. Obesity: We had a [place] discussion about diet, exercise, options. Pulmonary nodule: Per CT November 2020, has a 2 mm nodule RUL, + FH lung cancer, former smoker, schedule a CT -Family history of lung cancer, patient is a former smoker. CT chest November 2020: 2 mm  subpleural nodules right upper lobe, 12 months follow-up if high risk.  + FH lung cancer x2, schedule a CT chest.  RTC 1 year    This visit occurred during the SARS-CoV-2 public health emergency.  Safety protocols were in place, including screening questions prior to the visit, additional usage of staff PPE, and extensive cleaning of exam room while observing appropriate contact time as indicated for disinfecting solutions.

## 2020-10-10 NOTE — Progress Notes (Signed)
Pre visit review using our clinic review tool, if applicable. No additional management support is needed unless otherwise documented below in the visit note. 

## 2020-10-10 NOTE — Patient Instructions (Addendum)
Try to increase your physical activity  For your diet considering weight watchers, also working you information about the wellness clinic.  We are trying to get the CAT scan of the chest approved.   GO TO THE LAB : Get the blood work     [place], [place] Come back for a physical exam in 1 year

## 2020-10-11 ENCOUNTER — Encounter: Payer: Self-pay | Admitting: Internal Medicine

## 2020-10-11 LAB — COMPREHENSIVE METABOLIC PANEL
AG Ratio: 1.8 (calc) (ref 1.0–2.5)
ALT: 10 U/L (ref 6–29)
AST: 20 U/L (ref 10–35)
Albumin: 4.4 g/dL (ref 3.6–5.1)
Alkaline phosphatase (APISO): 65 U/L (ref 37–153)
BUN: 15 mg/dL (ref 7–25)
CO2: 26 mmol/L (ref 20–32)
Calcium: 9.9 mg/dL (ref 8.6–10.4)
Chloride: 102 mmol/L (ref 98–110)
Creat: 0.85 mg/dL (ref 0.50–1.05)
Globulin: 2.5 g/dL (calc) (ref 1.9–3.7)
Glucose, Bld: 78 mg/dL (ref 65–99)
Potassium: 4.9 mmol/L (ref 3.5–5.3)
Sodium: 138 mmol/L (ref 135–146)
Total Bilirubin: 0.6 mg/dL (ref 0.2–1.2)
Total Protein: 6.9 g/dL (ref 6.1–8.1)

## 2020-10-11 LAB — HEMOGLOBIN A1C
Hgb A1c MFr Bld: 5.9 % of total Hgb — ABNORMAL HIGH (ref <5.7)
Mean Plasma Glucose: 123 (calc)
eAG (mmol/L): 6.8 (calc)

## 2020-10-11 LAB — CBC WITH DIFFERENTIAL/PLATELET
Absolute Monocytes: 348 cells/uL (ref 200–950)
Basophils Absolute: 51 cells/uL (ref 0–200)
Basophils Relative: 0.9 %
Eosinophils Absolute: 40 cells/uL (ref 15–500)
Eosinophils Relative: 0.7 %
HCT: 38.3 % (ref 35.0–45.0)
Hemoglobin: 12.8 g/dL (ref 11.7–15.5)
Lymphs Abs: 1784 cells/uL (ref 850–3900)
MCH: 31 pg (ref 27.0–33.0)
MCHC: 33.4 g/dL (ref 32.0–36.0)
MCV: 92.7 fL (ref 80.0–100.0)
MPV: 11.1 fL (ref 7.5–12.5)
Monocytes Relative: 6.1 %
Neutro Abs: 3477 cells/uL (ref 1500–7800)
Neutrophils Relative %: 61 %
Platelets: 270 10*3/uL (ref 140–400)
RBC: 4.13 10*6/uL (ref 3.80–5.10)
RDW: 12.1 % (ref 11.0–15.0)
Total Lymphocyte: 31.3 %
WBC: 5.7 10*3/uL (ref 3.8–10.8)

## 2020-10-11 LAB — LIPID PANEL
Cholesterol: 224 mg/dL — ABNORMAL HIGH (ref <200)
HDL: 66 mg/dL (ref >=50)
LDL Cholesterol (Calc): 136 mg/dL (calc) — ABNORMAL HIGH
Non-HDL Cholesterol (Calc): 158 mg/dL (calc) — ABNORMAL HIGH (ref <130)
Total CHOL/HDL Ratio: 3.4 (calc) (ref <5.0)
Triglycerides: 114 mg/dL (ref <150)

## 2020-10-11 LAB — TSH: TSH: 2.29 mIU/L (ref 0.40–4.50)

## 2020-10-11 NOTE — Assessment & Plan Note (Signed)
-  Td 2014  - s/p shingrix - s/p covid vax x 3 - flu shot: today -Female care: [id number] ([facility]) PAP 2019 ([facility]) DEXA  2019 ([facility]) --Cscope 11-05 + polyps, C-scope 09/2009, normal.  C-scope 11/2019, next per GI -- diet and exercise: Extensively discussed.  Weight watchers?  Wellness clinic?  Also recommend increase physical activity.  - labs CMP FLP CBC A1c TSH

## 2020-10-11 NOTE — Assessment & Plan Note (Addendum)
Here for CPX Hyperglycemia: Checking labs High cholesterol: On Lipitor, checking labs Anxiety depression: She also has some insomnia, symptoms controlled on Zoloft, Ativan.  Still has occasional difficulty sleeping. Obesity: We had a [place] discussion about diet, exercise, options. Pulmonary nodule: Per CT November 2020, has a 2 mm nodule RUL, + FH lung cancer, former smoker, schedule a CT -Family history of lung cancer, patient is a former smoker. CT chest November 2020: 2 mm subpleural nodules right upper lobe, 12 months follow-up if high risk.  + FH lung cancer x2, schedule a CT chest.  RTC 1 year

## 2020-10-12 LAB — DRUG MONITORING, PANEL 8 WITH CONFIRMATION, URINE
6 Acetylmorphine: NEGATIVE ng/mL (ref <10)
Alcohol Metabolites: NEGATIVE ng/mL
Alphahydroxyalprazolam: NEGATIVE ng/mL (ref <25)
Alphahydroxymidazolam: NEGATIVE ng/mL (ref <50)
Alphahydroxytriazolam: NEGATIVE ng/mL (ref <50)
Aminoclonazepam: NEGATIVE ng/mL (ref <25)
Amphetamines: NEGATIVE ng/mL (ref <500)
Benzodiazepines: POSITIVE ng/mL — AB (ref <100)
Buprenorphine, Urine: NEGATIVE ng/mL (ref <5)
Cocaine Metabolite: NEGATIVE ng/mL (ref <150)
Creatinine: 43.7 mg/dL
Hydroxyethylflurazepam: NEGATIVE ng/mL (ref <50)
Lorazepam: 108 ng/mL — ABNORMAL HIGH (ref <50)
MDMA: NEGATIVE ng/mL (ref <500)
Marijuana Metabolite: NEGATIVE ng/mL (ref <20)
Nordiazepam: NEGATIVE ng/mL (ref <50)
Opiates: NEGATIVE ng/mL (ref <100)
Oxazepam: NEGATIVE ng/mL (ref <50)
Oxidant: NEGATIVE ug/mL
Oxycodone: NEGATIVE ng/mL (ref <100)
Temazepam: NEGATIVE ng/mL (ref <50)
pH: 6.5 (ref 4.5–9.0)

## 2020-10-12 LAB — DM TEMPLATE

## 2020-10-16 ENCOUNTER — Telehealth: Payer: Self-pay | Admitting: Internal Medicine

## 2020-10-16 NOTE — Telephone Encounter (Signed)
Call at [phone] for a peer-to-peer discussion, I was put on hold for 15 minutes.  We will try again

## 2020-10-18 NOTE — Telephone Encounter (Addendum)
Called [facility] after waiting on provider peer to peer line for > 10 minutes, was informed that claim had been denied yesterday. Transferred to appeal line and left detailed voicemail with the requested information. Pt had original CT chest w/o on 10/19/2019 showing 2 mm subpleural nodule in the posterior right upper lobe, radiology recommended f/u in 1 year d/t Pt's history of smoking. I requested that if they need further information to call the office.

## 2020-10-19 NOTE — Telephone Encounter (Signed)
Received call from [facility] Danae Chen), she informed that she is going to reopen the case on Pt for CT chest w/o. They are needing OV notes and previous CT scan faxed to ATTN: Appeals at [phone] by 10/24/2020- informed I'd go ahead and send information today. If Danae Chen needs to be contact her direct number is [phone].

## 2020-10-20 DIAGNOSIS — H1013 Acute atopic conjunctivitis, bilateral: Secondary | ICD-10-CM | POA: Diagnosis not present

## 2020-10-24 NOTE — Telephone Encounter (Signed)
Spoke w/ Adrienne Burke- informed that she did receive information via fax. She will review, should have a determination by Friday, November 19.

## 2020-10-24 NOTE — Telephone Encounter (Signed)
Spoke w/ Danae Chen- verified fax number- will resend needed information now.

## 2020-10-25 NOTE — Telephone Encounter (Signed)
Approved. Preauthorization number: [id number].

## 2020-10-29 ENCOUNTER — Telehealth: Payer: Self-pay | Admitting: Internal Medicine

## 2020-10-29 NOTE — Telephone Encounter (Signed)
Requesting: lorazepam 0.5mg  Contract: 10/10/2020 UDS: 10/10/2020  Last Visit: 10/10/2020 Next Visit: None scheduled  Last Refill: 06/12/2020 #30 and 4RF Pt sig: 1 tab qhs prn  Please Advise

## 2020-10-29 NOTE — Telephone Encounter (Signed)
PDMP okay, Rx sent 

## 2020-11-05 ENCOUNTER — Ambulatory Visit (HOSPITAL_BASED_OUTPATIENT_CLINIC_OR_DEPARTMENT_OTHER)
Admission: RE | Admit: 2020-11-05 | Discharge: 2020-11-05 | Disposition: A | Discharge status: Home / Self Care | Payer: BC Managed Care – PPO | Source: Ambulatory Visit | Attending: Internal Medicine | Admitting: Internal Medicine

## 2020-11-05 ENCOUNTER — Other Ambulatory Visit: Payer: Self-pay

## 2020-11-05 DIAGNOSIS — R911 Solitary pulmonary nodule: Secondary | ICD-10-CM | POA: Insufficient documentation

## 2020-11-05 DIAGNOSIS — J984 Other disorders of lung: Secondary | ICD-10-CM | POA: Diagnosis not present

## 2020-11-07 ENCOUNTER — Encounter: Payer: Self-pay | Admitting: Internal Medicine

## 2020-12-06 DIAGNOSIS — H5203 Hypermetropia, bilateral: Secondary | ICD-10-CM | POA: Diagnosis not present

## 2020-12-06 DIAGNOSIS — H524 Presbyopia: Secondary | ICD-10-CM | POA: Diagnosis not present

## 2020-12-23 ENCOUNTER — Other Ambulatory Visit: Payer: Self-pay | Admitting: Internal Medicine

## 2020-12-27 IMAGING — DX PORTABLE CHEST - 1 VIEW
1 series · 1 of 1 positions shown · non-contrast
Comparison: Prior chest x-ray 08/29/2014

CLINICAL DATA: 57-year-old female with flu-like symptoms for the
past 6 days

EXAM:
PORTABLE CHEST 1 VIEW

[chest ap]
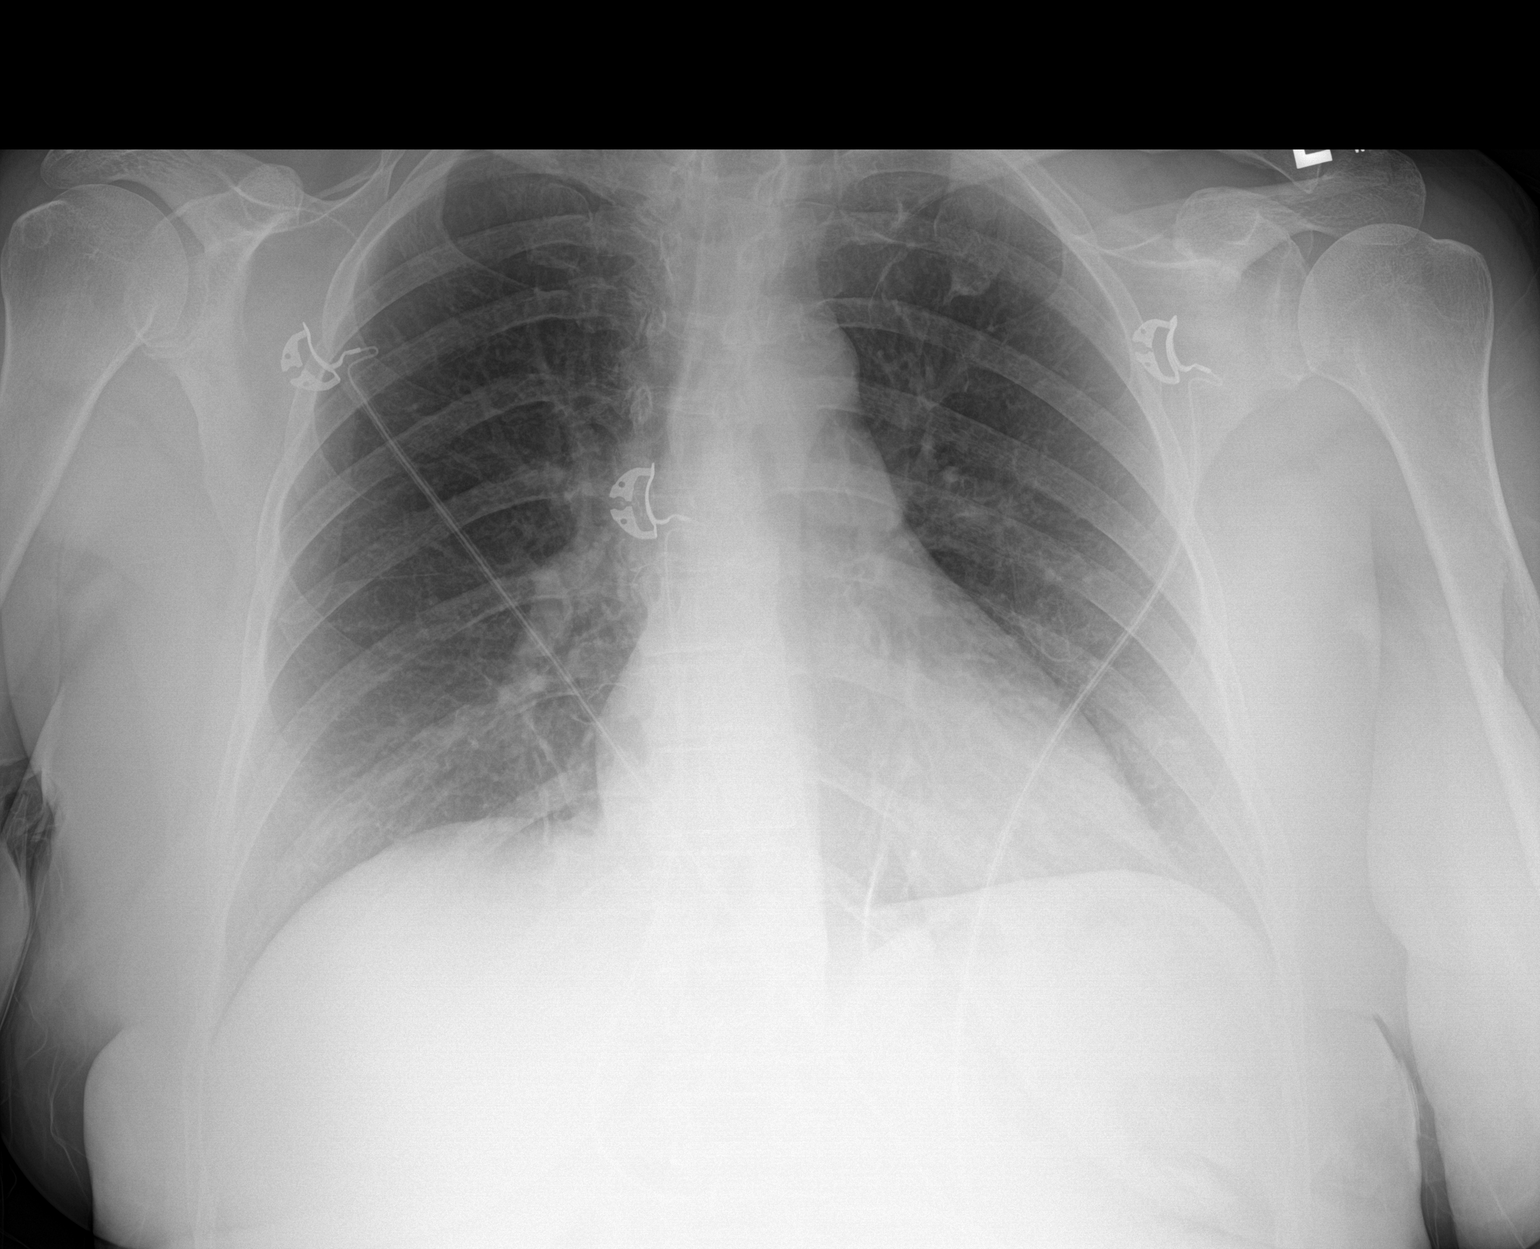

[1 of 1 positions shown; findings below may reference images not displayed]

FINDINGS: The lungs are clear and negative for focal airspace consolidation,
pulmonary edema or suspicious pulmonary nodule. No pleural effusion
or pneumothorax. Cardiac and mediastinal contours are within normal
limits. No acute fracture or lytic or blastic osseous lesions. The
visualized upper abdominal bowel gas pattern is unremarkable.
IMPRESSION: No active disease.

## 2021-01-24 ENCOUNTER — Encounter: Payer: Self-pay | Admitting: Internal Medicine

## 2021-01-24 MED ORDER — LORAZEPAM 0.5 MG PO TABS: 0.5000 mg | ORAL_TABLET | Freq: Every evening | ORAL | 1 refills | Status: DC | PRN

## 2021-02-19 ENCOUNTER — Encounter: Payer: Self-pay | Admitting: Internal Medicine

## 2021-03-27 ENCOUNTER — Other Ambulatory Visit: Payer: Self-pay

## 2021-03-27 ENCOUNTER — Ambulatory Visit
Admission: RE | Admit: 2021-03-27 | Discharge: 2021-03-27 | Disposition: A | Discharge status: Home / Self Care | Payer: BC Managed Care – PPO | Source: Ambulatory Visit | Attending: Obstetrics and Gynecology | Admitting: Obstetrics and Gynecology

## 2021-03-27 DIAGNOSIS — R921 Mammographic calcification found on diagnostic imaging of breast: Secondary | ICD-10-CM

## 2021-05-13 ENCOUNTER — Other Ambulatory Visit: Payer: Self-pay | Admitting: Internal Medicine

## 2021-05-20 ENCOUNTER — Telehealth: Payer: Self-pay | Admitting: Internal Medicine

## 2021-05-20 NOTE — Telephone Encounter (Signed)
PDMP okay, Rx sent. Next visit should be around November 2022

## 2021-05-20 NOTE — Telephone Encounter (Signed)
Requesting: lorazepam 0.5mg  Contract: 10/10/2020 UDS: 10/10/2020 Last Visit: 10/10/2020 Next Visit: None Last Refill: 01/24/2021 #30 and 1RF  Please Advise

## 2021-06-09 DIAGNOSIS — R197 Diarrhea, unspecified: Secondary | ICD-10-CM | POA: Diagnosis not present

## 2021-06-09 DIAGNOSIS — J0140 Acute pansinusitis, unspecified: Secondary | ICD-10-CM | POA: Diagnosis not present

## 2021-06-09 DIAGNOSIS — R519 Headache, unspecified: Secondary | ICD-10-CM | POA: Diagnosis not present

## 2021-06-09 DIAGNOSIS — Z20822 Contact with and (suspected) exposure to covid-19: Secondary | ICD-10-CM | POA: Diagnosis not present

## 2021-06-09 DIAGNOSIS — H938X3 Other specified disorders of ear, bilateral: Secondary | ICD-10-CM | POA: Diagnosis not present

## 2021-06-09 DIAGNOSIS — R0981 Nasal congestion: Secondary | ICD-10-CM | POA: Diagnosis not present

## 2021-06-09 DIAGNOSIS — B349 Viral infection, unspecified: Secondary | ICD-10-CM | POA: Diagnosis not present

## 2021-06-09 DIAGNOSIS — R070 Pain in throat: Secondary | ICD-10-CM | POA: Diagnosis not present

## 2021-07-10 DIAGNOSIS — L821 Other seborrheic keratosis: Secondary | ICD-10-CM | POA: Diagnosis not present

## 2021-07-10 DIAGNOSIS — L814 Other melanin hyperpigmentation: Secondary | ICD-10-CM | POA: Diagnosis not present

## 2021-07-10 DIAGNOSIS — L570 Actinic keratosis: Secondary | ICD-10-CM | POA: Diagnosis not present

## 2021-07-10 DIAGNOSIS — L219 Seborrheic dermatitis, unspecified: Secondary | ICD-10-CM | POA: Diagnosis not present

## 2021-07-12 ENCOUNTER — Ambulatory Visit: Payer: BC Managed Care – PPO | Admitting: Internal Medicine

## 2021-07-19 ENCOUNTER — Other Ambulatory Visit: Payer: Self-pay

## 2021-07-19 ENCOUNTER — Ambulatory Visit (INDEPENDENT_AMBULATORY_CARE_PROVIDER_SITE_OTHER): Payer: BC Managed Care – PPO | Admitting: Internal Medicine

## 2021-07-19 ENCOUNTER — Encounter: Payer: Self-pay | Admitting: Internal Medicine

## 2021-07-19 VITALS — BP 114/66 | HR 69 | Temp 98.3°F | Resp 16 | Ht 65.0 in | Wt 184.5 lb

## 2021-07-19 DIAGNOSIS — E785 Hyperlipidemia, unspecified: Secondary | ICD-10-CM | POA: Diagnosis not present

## 2021-07-19 DIAGNOSIS — F419 Anxiety disorder, unspecified: Secondary | ICD-10-CM | POA: Diagnosis not present

## 2021-07-19 DIAGNOSIS — R739 Hyperglycemia, unspecified: Secondary | ICD-10-CM

## 2021-07-19 DIAGNOSIS — F32A Depression, unspecified: Secondary | ICD-10-CM

## 2021-07-19 DIAGNOSIS — Z7185 Encounter for immunization safety counseling: Secondary | ICD-10-CM

## 2021-07-19 DIAGNOSIS — R911 Solitary pulmonary nodule: Secondary | ICD-10-CM

## 2021-07-19 NOTE — Patient Instructions (Addendum)
Recommend to proceed with covid vaccine #4 at your convenience  Flu shot this fall    [place], [place] back for   blood work next week, make an appointment  Come back for a physical exam in 6 months

## 2021-07-19 NOTE — Progress Notes (Signed)
Subjective:    Patient ID: Adrienne Burke, female    DOB: [birth year], 60 y.o.   MRN: [id number]  DOS:  07/19/2021 Type of visit - description: Routine visit Since the last office visit she is doing well. Diet has not been the best lately. Weight has been up and down. Other issues seems stable.  Emotionally doing well   Review of Systems See above   Past Medical History:  Diagnosis Date   Actinic keratitis    hx of   Anxiety and depression    Atrophic vaginitis    Colon polyp    Hyperplastic polyps 10/2004, Normal Cscope 2010    ENDOMETRIOSIS    Genital HSV    HYPERLIPIDEMIA    Insomnia    Ovarian cyst    bilateral, s/p drainage    Past Surgical History:  Procedure Laterality Date   ANKLE FRACTURE SURGERY     APPENDECTOMY     BREAST IMPLANT EXCHANGE     breast lift-implant 2009, repeated 08-2009     COLONOSCOPY  2010,2015   EYE SURGERY     [place] for vision correction 02-2012   POLYPECTOMY  2015   adenoma polyp   TONSILLECTOMY AND ADENOIDECTOMY  1966   TUBAL LIGATION     uterine ablation      Allergies as of 07/19/2021       Reactions   Codeine Phosphate    REACTION: unspecified   Penicillins    REACTION: unspecified   Sulfonamide Derivatives    REACTION: unspecified        Medication List        Accurate as of July 19, 2021 11:59 PM. If you have any questions, ask your nurse or doctor.          atorvastatin 10 MG tablet Commonly known as: LIPITOR Take 1 tablet (10 mg total) by mouth daily.   LORazepam 0.5 MG tablet Commonly known as: ATIVAN TAKE 1 TABLET BY MOUTH AT BEDTIME AS NEEDED FOR ANXIETY.   multivitamin,tx-minerals tablet Take 1 tablet by mouth daily.   sertraline 50 MG tablet Commonly known as: ZOLOFT Take 1 tablet (50 mg total) by mouth daily.           Objective:   Physical Exam BP 114/66 (BP Location: Left Arm, Patient Position: Sitting, Cuff Size: Normal)   Pulse 69   Temp 98.3 F (36.8 C) (Oral)   Resp 16    Ht '5\' 5"'$  (1.651 m)   Wt 184 lb 8 oz (83.7 kg)   SpO2 97%   BMI 30.70 kg/m  General:   Well developed, NAD, BMI noted. HEENT:  Normocephalic . Face symmetric, atraumatic Lungs:  CTA B Normal respiratory effort, no intercostal retractions, no accessory muscle use. Heart: RRR,  no murmur.  Lower extremities: no pretibial edema bilaterally  Skin: Not pale. Not jaundice Neurologic:  alert & oriented X3.  Speech normal, gait appropriate for age and unassisted Psych--  Cognition and judgment appear intact.  Cooperative with normal attention span and concentration.  Behavior appropriate. No anxious or depressed appearing.      Assessment     Assessment  Hyperglycemia A1c 6.0 01-2018 Hyperlipidemia Anxiety, depression GYN: Endometriosis. s/p exploration , DUB, endometrial ablasion H/o actinic keratosis, sees derm q year as of 05/2018 Pulmonary nodules: Last CT 10-2020, stable.  No further scheduled CT  PLAN Hyperglycemia: A1c November 2021 5.9.  Will recheck. High cholesterol: Not as well-controlled per last FLP, on atorvastatin, check FLP. Overweight: Unable  to lose weight, we had a [place] conversation about diet exercise, I think she would benefit from a structured program such as weight watchers or NOOM Pulmonary nodule: Stable per CT 10-2020, no further CTs Vaccines: Recommend flu shot this fall, advise COVID-vaccine #4, she is somewhat reluctant. RTC next week fasting for labs RTC CPX in 6 months   This visit occurred during the SARS-CoV-2 public health emergency.  Safety protocols were in place, including screening questions prior to the visit, additional usage of staff PPE, and extensive cleaning of exam room while observing appropriate contact time as indicated for disinfecting solutions.

## 2021-07-20 NOTE — Assessment & Plan Note (Signed)
Hyperglycemia: A1c November 2021 5.9.  Will recheck. High cholesterol: Not as well-controlled per last FLP, on atorvastatin, check FLP. Overweight: Unable to lose weight, we had a [place] conversation about diet exercise, I think she would benefit from a structured program such as weight watchers or NOOM Pulmonary nodule: Stable per CT 10-2020, no further CTs Vaccines: Recommend flu shot this fall, advise COVID-vaccine #4, she is somewhat reluctant. RTC next week fasting for labs RTC CPX in 6 months

## 2021-07-25 ENCOUNTER — Other Ambulatory Visit: Payer: Self-pay

## 2021-07-25 ENCOUNTER — Other Ambulatory Visit (INDEPENDENT_AMBULATORY_CARE_PROVIDER_SITE_OTHER): Payer: BC Managed Care – PPO

## 2021-07-25 DIAGNOSIS — E785 Hyperlipidemia, unspecified: Secondary | ICD-10-CM | POA: Diagnosis not present

## 2021-07-25 DIAGNOSIS — R739 Hyperglycemia, unspecified: Secondary | ICD-10-CM

## 2021-07-25 LAB — LIPID PANEL
Cholesterol: 235 mg/dL — ABNORMAL HIGH (ref 0–200)
HDL: 70 mg/dL (ref >39.00)
LDL Cholesterol: 138 mg/dL — ABNORMAL HIGH (ref 0–99)
NonHDL: 165.06
Total CHOL/HDL Ratio: 3
Triglycerides: 136 mg/dL (ref 0.0–149.0)
VLDL: 27.2 mg/dL (ref 0.0–40.0)

## 2021-07-25 LAB — HEMOGLOBIN A1C: Hgb A1c MFr Bld: 6 % (ref 4.6–6.5)

## 2021-07-29 ENCOUNTER — Encounter: Payer: Self-pay | Admitting: Internal Medicine

## 2021-07-29 MED ORDER — ATORVASTATIN CALCIUM 10 MG PO TABS: 15.0000 mg | ORAL_TABLET | Freq: Every day | ORAL | 1 refills | Status: DC

## 2021-10-01 ENCOUNTER — Other Ambulatory Visit: Payer: Self-pay | Admitting: Obstetrics and Gynecology

## 2021-10-01 DIAGNOSIS — Z1231 Encounter for screening mammogram for malignant neoplasm of breast: Secondary | ICD-10-CM

## 2021-10-13 ENCOUNTER — Telehealth: Payer: Self-pay | Admitting: Internal Medicine

## 2021-10-14 NOTE — Telephone Encounter (Signed)
PDMP okay, Rx sent 

## 2021-10-14 NOTE — Telephone Encounter (Signed)
Requesting: lorazepam 0.5mg   Contract: 10/10/2020 UDS: 10/10/2020 Last Visit: 07/19/2021 Next Visit: 01/07/2022 Last Refill: 05/20/2021 #30 and 4RF  Please Advise

## 2021-11-04 ENCOUNTER — Other Ambulatory Visit: Payer: Self-pay | Admitting: Internal Medicine

## 2021-12-10 NOTE — Progress Notes (Signed)
[place] Phone: [phone] Subjective:   [name], am serving as a scribe for Dr. Hulan Saas. This visit occurred during the SARS-CoV-2 public health emergency.  Safety protocols were in place, including screening questions prior to the visit, additional usage of staff PPE, and extensive cleaning of exam room while observing appropriate contact time as indicated for disinfecting solutions.  I'm seeing this patient by the request  of:  Colon Branch, MD  CC: Right shoulder pain  [id number]  Adrienne Burke is a 61 y.o. female coming in with complaint of R shoulder pain. Seen for trigger p;points in R shoulder in July 2020. Patient states that her pain is chronic and occurring IR and abduction. Achy in character. Denies any radiating symptoms. Uses IBU prn.       Past Medical History:  Diagnosis Date   Actinic keratitis    hx of   Anxiety and depression    Atrophic vaginitis    Colon polyp    Hyperplastic polyps 10/2004, Normal Cscope 2010    ENDOMETRIOSIS    Genital HSV    HYPERLIPIDEMIA    Insomnia    Ovarian cyst    bilateral, s/p drainage   Past Surgical History:  Procedure Laterality Date   ANKLE FRACTURE SURGERY     APPENDECTOMY     BREAST IMPLANT EXCHANGE     breast lift-implant 2009, repeated 08-2009     COLONOSCOPY  2010,2015   EYE SURGERY     [place] for vision correction 02-2012   POLYPECTOMY  2015   adenoma polyp   TONSILLECTOMY AND ADENOIDECTOMY  1966   TUBAL LIGATION     uterine ablation     Social History   Socioeconomic History   Marital status: Married    Spouse name: Not on file   Number of children: 2   Years of education: Not on file   Highest education level: Not on file  Occupational History   Occupation: pay roll-tax analyst  Tobacco Use   Smoking status: Former    Packs/day: 0.50    Years: 15.00    Pack years: 7.50    Types: Cigarettes    Start date:  1    Quit date: 1996    Years since quitting: 27.0   Smokeless tobacco: Never   Tobacco comments:    quit 1997  Vaping Use   Vaping Use: Never used  Substance and Sexual Activity   Alcohol use: Yes    Alcohol/week: 1.0 standard drink    Types: 1 Standard drinks or equivalent per week    Comment: socially    Drug use: No   Sexual activity: Not on file  Other Topics Concern   Not on file  Social History Narrative   G3 P2 ab 1     Married to [place] , 1 child alive , lost one            Social Determinants of Radio broadcast assistant Strain: Not on file  Food Insecurity: Not on file  Transportation Needs: Not on file  Physical Activity: Not on file  Stress: Not on file  Social Connections: Not on file   Allergies  Allergen Reactions   Codeine Phosphate     REACTION: unspecified   Penicillins     REACTION: unspecified   Sulfonamide Derivatives     REACTION: unspecified   Family History  Problem Relation Age of Onset  Cancer - Lung Mother        smoker   Skin cancer Mother    COPD Mother    Cancer - Lung Father        smoker   Heart disease Maternal Grandmother    Heart disease Maternal Grandfather    Heart disease Paternal Grandmother    Heart disease Paternal Grandfather    Colon cancer Paternal Grandfather 83   Breast cancer Neg Hx    Colon polyps Neg Hx    Esophageal cancer Neg Hx    Rectal cancer Neg Hx    Stomach cancer Neg Hx      Current Outpatient Medications (Cardiovascular):    atorvastatin (LIPITOR) 10 MG tablet, TAKE 1 TABLET BY MOUTH EVERY DAY     Current Outpatient Medications (Other):    LORazepam (ATIVAN) 0.5 MG tablet, TAKE 1 TABLET BY MOUTH AT BEDTIME AS NEEDED FOR ANXIETY   Multiple Vitamins-Minerals (MULTIVITAMIN,TX-MINERALS) tablet, Take 1 tablet by mouth daily.     sertraline (ZOLOFT) 50 MG tablet, Take 1 tablet (50 mg total) by mouth daily.   Reviewed prior external information including notes and imaging from  primary  care provider As well as notes that were available from care everywhere and other healthcare systems.  Past medical history, social, surgical and family history all reviewed in electronic medical record.  No pertanent information unless stated regarding to the chief complaint.   Review of Systems:  No headache, visual changes, nausea, vomiting, diarrhea, constipation, dizziness, abdominal pain, skin rash, fevers, chills, night sweats, weight loss, swollen lymph nodes, body aches, joint swelling, chest pain, shortness of breath, mood changes. POSITIVE muscle aches  Objective  Blood pressure 108/76, pulse 75, height 5\' 5"  (1.651 m), weight 196 lb (88.9 kg), SpO2 96 %.   General: No apparent distress alert and oriented x3 mood and affect normal, dressed appropriately.  HEENT: Pupils equal, extraocular movements intact  Respiratory: Patient's speak in full sentences and does not appear short of breath  Cardiovascular: No lower extremity edema, non tender, no erythema  Gait normal with good balance and coordination.  MSK: Shoulder: Right Inspection reveals no abnormalities, atrophy or asymmetry. Palpation is normal with no tenderness over AC joint or bicipital groove. ROM is full in all planes passively. Rotator cuff strength normal throughout. signs of impingement with positive Neer and Hawkin's tests, but negative empty can sign. Speeds and Yergason's tests normal. No labral pathology noted with negative Obrien's, negative clunk and good stability.  Positive crossover Normal scapular function observed. No painful arc and no drop arm sign. No apprehension sign  MSK US performed of: Right This study was ordered, performed, and interpreted by Charlann Boxer D.O.  Shoulder:   Supraspinatus: Mild degenerative changes but no acute tear or retraction noted bursal bulge seen with shoulder abduction on impingement view. Subscapularis:  Appears normal on Harl and transverse views. Positive  bursa Teres Minor:  Appears normal on Pricer and transverse views. AC joint: Does have effusion noted. Glenohumeral Joint:  Appears normal without effusion. Glenoid Labrum:  Intact without visualized tears. Biceps Tendon:  Appears normal on Tirado and transverse views, no fraying of tendon, tendon located in intertubercular groove, no subluxation with shoulder internal or external rotation.  Impression: Subacromial bursitis mild degenerative changes of the supraspinatus as well as acromioclavicular effusion  Procedure: Real-time Ultrasound Guided Injection of right subacromial space Device: GE Logiq E  Ultrasound guided injection is preferred based studies that show increased duration, increased effect, greater accuracy,  decreased procedural pain, increased response rate with ultrasound guided versus blind injection.  Verbal informed consent obtained.  Time-out conducted.  Noted no overlying erythema, induration, or other signs of local infection.  Skin prepped in a sterile fashion.  Local anesthesia: Topical Ethyl chloride.  With sterile technique and under real time ultrasound guidance:  Joint visualized.  23g 1  inch needle inserted lateral approach. Pictures taken for needle placement. Patient did have injection of  2 cc of 0.5% Marcaine, and 1.0 cc of Kenalog 40 mg/dL. Completed without difficulty  Pain immediately resolved suggesting accurate placement of the medication.  Advised to call if fevers/chills, erythema, induration, drainage, or persistent bleeding.  Impression: Technically successful ultrasound guided injection.  Procedure: Real-time Ultrasound Guided Injection of right acromioclavicular joint Device: GE Logiq Q7 Ultrasound guided injection is preferred based studies that show increased duration, increased effect, greater accuracy, decreased procedural pain, increased response rate, and decreased cost with ultrasound guided versus blind injection.  Verbal informed consent  obtained.  Time-out conducted.  Noted no overlying erythema, induration, or other signs of local infection.  Skin prepped in a sterile fashion.  Local anesthesia: Topical Ethyl chloride.  With sterile technique and under real time ultrasound guidance: With a 25-gauge half inch needle injected with 0.5 cc of 0.5% Marcaine and 0.5 cc of Kenalog 40 mg/mL Completed without difficulty  Pain immediately improved suggesting accurate placement of the medication.  Advised to call if fevers/chills, erythema, induration, drainage, or persistent bleeding.  Impression: Technically successful ultrasound guided injection.   Impression and Recommendations:    The above documentation has been reviewed and is accurate and complete Lyndal Pulley, DO

## 2021-12-11 ENCOUNTER — Ambulatory Visit (INDEPENDENT_AMBULATORY_CARE_PROVIDER_SITE_OTHER): Payer: Commercial Managed Care - PPO

## 2021-12-11 ENCOUNTER — Encounter: Payer: Self-pay | Admitting: Family Medicine

## 2021-12-11 ENCOUNTER — Other Ambulatory Visit: Payer: Self-pay

## 2021-12-11 ENCOUNTER — Ambulatory Visit: Payer: Self-pay

## 2021-12-11 ENCOUNTER — Ambulatory Visit (INDEPENDENT_AMBULATORY_CARE_PROVIDER_SITE_OTHER): Payer: Commercial Managed Care - PPO | Admitting: Family Medicine

## 2021-12-11 VITALS — BP 108/76 | HR 75 | Ht 65.0 in | Wt 196.0 lb

## 2021-12-11 DIAGNOSIS — M19011 Primary osteoarthritis, right shoulder: Secondary | ICD-10-CM | POA: Insufficient documentation

## 2021-12-11 DIAGNOSIS — M7551 Bursitis of right shoulder: Secondary | ICD-10-CM | POA: Diagnosis not present

## 2021-12-11 DIAGNOSIS — M25511 Pain in right shoulder: Secondary | ICD-10-CM

## 2021-12-11 NOTE — Assessment & Plan Note (Signed)
Morbid bursitis.  Injected the subacromial space.  Does have some mild degenerative changes of the shoulder noted of the supraspinatus but likely will do relatively well.  Discussed icing regimen and home exercises, discussed which activities to do to avoid.  Increase activity slowly.  Follow-up again in 6 to 8 weeks.

## 2021-12-11 NOTE — Patient Instructions (Addendum)
Xray today 2 Injections in shoulder today Exercises Ice 20 min 2x a day See me again in 5-6 weeks

## 2021-12-11 NOTE — Assessment & Plan Note (Signed)
Patient given injection and tolerated the procedure well, discussed icing regimen and home exercises.  Discussed which activities to do which wants to avoid.  Increase activity slowly.  Follow-up again in 6 to 8 weeks.

## 2022-01-07 ENCOUNTER — Encounter: Payer: Self-pay | Admitting: Internal Medicine

## 2022-01-07 ENCOUNTER — Ambulatory Visit (INDEPENDENT_AMBULATORY_CARE_PROVIDER_SITE_OTHER): Payer: Commercial Managed Care - PPO | Admitting: Internal Medicine

## 2022-01-07 VITALS — BP 118/82 | HR 59 | Temp 97.8°F | Resp 16 | Ht 65.0 in | Wt 195.1 lb

## 2022-01-07 DIAGNOSIS — E6609 Other obesity due to excess calories: Secondary | ICD-10-CM

## 2022-01-07 DIAGNOSIS — E785 Hyperlipidemia, unspecified: Secondary | ICD-10-CM | POA: Diagnosis not present

## 2022-01-07 DIAGNOSIS — R739 Hyperglycemia, unspecified: Secondary | ICD-10-CM | POA: Diagnosis not present

## 2022-01-07 DIAGNOSIS — Z0000 Encounter for general adult medical examination without abnormal findings: Secondary | ICD-10-CM | POA: Diagnosis not present

## 2022-01-07 DIAGNOSIS — Z6830 Body mass index (BMI) 30.0-30.9, adult: Secondary | ICD-10-CM

## 2022-01-07 LAB — COMPREHENSIVE METABOLIC PANEL
ALT: 7 U/L (ref 0–35)
AST: 16 U/L (ref 0–37)
Albumin: 4.4 g/dL (ref 3.5–5.2)
Alkaline Phosphatase: 77 U/L (ref 39–117)
BUN: 17 mg/dL (ref 6–23)
CO2: 29 mEq/L (ref 19–32)
Calcium: 9.9 mg/dL (ref 8.4–10.5)
Chloride: 101 mEq/L (ref 96–112)
Creatinine, Ser: 0.79 mg/dL (ref 0.40–1.20)
GFR: 82.36 mL/min (ref >60.00)
Glucose, Bld: 90 mg/dL (ref 70–99)
Potassium: 4.5 mEq/L (ref 3.5–5.1)
Sodium: 136 mEq/L (ref 135–145)
Total Bilirubin: 0.5 mg/dL (ref 0.2–1.2)
Total Protein: 7 g/dL (ref 6.0–8.3)

## 2022-01-07 LAB — CBC WITH DIFFERENTIAL/PLATELET
Basophils Absolute: 0.1 10*3/uL (ref 0.0–0.1)
Basophils Relative: 1 % (ref 0.0–3.0)
Eosinophils Absolute: 0.1 10*3/uL (ref 0.0–0.7)
Eosinophils Relative: 1.2 % (ref 0.0–5.0)
HCT: 39.2 % (ref 36.0–46.0)
Hemoglobin: 12.8 g/dL (ref 12.0–15.0)
Lymphocytes Relative: 32 % (ref 12.0–46.0)
Lymphs Abs: 1.8 10*3/uL (ref 0.7–4.0)
MCHC: 32.7 g/dL (ref 30.0–36.0)
MCV: 92.4 fl (ref 78.0–100.0)
Monocytes Absolute: 0.4 10*3/uL (ref 0.1–1.0)
Monocytes Relative: 6.4 % (ref 3.0–12.0)
Neutro Abs: 3.4 10*3/uL (ref 1.4–7.7)
Neutrophils Relative %: 59.4 % (ref 43.0–77.0)
Platelets: 276 10*3/uL (ref 150.0–400.0)
RBC: 4.24 Mil/uL (ref 3.87–5.11)
RDW: 13.6 % (ref 11.5–15.5)
WBC: 5.7 10*3/uL (ref 4.0–10.5)

## 2022-01-07 LAB — LIPID PANEL
Cholesterol: 241 mg/dL — ABNORMAL HIGH (ref 0–200)
HDL: 66.4 mg/dL (ref >39.00)
LDL Cholesterol: 143 mg/dL — ABNORMAL HIGH (ref 0–99)
NonHDL: 175.04
Total CHOL/HDL Ratio: 4
Triglycerides: 161 mg/dL — ABNORMAL HIGH (ref 0.0–149.0)
VLDL: 32.2 mg/dL (ref 0.0–40.0)

## 2022-01-07 LAB — TSH: TSH: 2.29 u[IU]/mL (ref 0.35–5.50)

## 2022-01-07 LAB — HEMOGLOBIN A1C: Hgb A1c MFr Bld: 6.1 % (ref 4.6–6.5)

## 2022-01-07 MED ORDER — ATORVASTATIN CALCIUM 10 MG PO TABS: 15.0000 mg | ORAL_TABLET | Freq: Every day | ORAL | 1 refills | Status: DC

## 2022-01-07 NOTE — Patient Instructions (Addendum)
Recommend a COVID booster  Please consider visit the healthy weight and wellness clinic.  Read the information about ACP.  GO TO THE LAB : Get the blood work     [place], [place] back for a checkup in 6 months

## 2022-01-07 NOTE — Assessment & Plan Note (Signed)
-  Td 2014  - s/p shingrix - s/p covid vax x 3, booster recommended - flu shot: Declines -Female care: [facility] 03/2021 ([facility]) PAP 2019,   has a  gyn appoint scheduled DEXA  2019 ([facility]) --Cscope 11-05 + polyps, C-scope 09/2009, normal.  C-scope 11/2019, next per GI -- diet and exercise: Extensive discussion about diet and exercise - labs:CMP, FLP, CBC, A1c, TSH - ACP info provided

## 2022-01-07 NOTE — Assessment & Plan Note (Signed)
Here for CPX Hyperglycemia: Check A1c Hyperlipidemia: Last LDL 138, was rec to increase atorvastatin to 20 mg, patient elected to increase to 15 mg and watch her lifestyle however she has not been able to eat healthier or increase exercise.  Check FLP. Anxiety, depression: On sertraline, well controlled Obesity: [place] discussion about diet, exercise, calorie counting, consider visit to the wellness clinic. RTC 6 months

## 2022-01-07 NOTE — Progress Notes (Signed)
Subjective:    Patient ID: Adrienne Burke, female    DOB: December 02, 1961, 61 y.o.   MRN: [id number]  DOS:  01/07/2022 Type of visit - description: CPX  In general feeling well. In the last 6 months, she changed jobs >>  some stress, was very busy, things are settling down    Wt Readings from Last 3 Encounters:  01/07/22 195 lb 2 oz (88.5 kg)  12/11/21 196 lb (88.9 kg)  07/19/21 184 lb 8 oz (83.7 kg)     Review of Systems  Other than above, a 14 point review of systems is negative     Past Medical History:  Diagnosis Date   Actinic keratitis    hx of   Anxiety and depression    Atrophic vaginitis    Colon polyp    Hyperplastic polyps 10/2004, Normal Cscope 2010    ENDOMETRIOSIS    Genital HSV    HYPERLIPIDEMIA    Insomnia    Ovarian cyst    bilateral, s/p drainage    Past Surgical History:  Procedure Laterality Date   ANKLE FRACTURE SURGERY     APPENDECTOMY     BREAST IMPLANT EXCHANGE     breast lift-implant 2009, repeated 08-2009     COLONOSCOPY  2010,2015   EYE SURGERY     [place] for vision correction 02-2012   POLYPECTOMY  2015   adenoma polyp   TONSILLECTOMY AND ADENOIDECTOMY  1966   TUBAL LIGATION     uterine ablation     Social History   Socioeconomic History   Marital status: Married    Spouse name: Not on file   Number of children: 2   Years of education: Not on file   Highest education level: Not on file  Occupational History   Occupation: pay roll-tax analyst  Tobacco Use   Smoking status: Former    Packs/day: 0.50    Years: 15.00    Pack years: 7.50    Types: Cigarettes    Start date: 73    Quit date: 1996    Years since quitting: 27.1   Smokeless tobacco: Never   Tobacco comments:    quit 1997  Vaping Use   Vaping Use: Never used  Substance and Sexual Activity   Alcohol use: Yes    Alcohol/week: 1.0 standard drink    Types: 1 Standard drinks or equivalent per week    Comment: socially    Drug use: No   Sexual activity: Not on  file  Other Topics Concern   Not on file  Social History Narrative   G3 P2 ab 1     Married to [place] , 1 child alive , lost one            Social Determinants of Radio broadcast assistant Strain: Not on file  Food Insecurity: Not on file  Transportation Needs: Not on file  Physical Activity: Not on file  Stress: Not on file  Social Connections: Not on file  Intimate Partner Violence: Not on file    Current Outpatient Medications  Medication Instructions   atorvastatin (LIPITOR) 15 mg, Oral, Daily   LORazepam (ATIVAN) 0.5 MG tablet TAKE 1 TABLET BY MOUTH AT BEDTIME AS NEEDED FOR ANXIETY   Multiple Vitamins-Minerals (MULTIVITAMIN,TX-MINERALS) tablet 1 tablet, Daily   sertraline (ZOLOFT) 50 mg, Oral, Daily       Objective:   Physical Exam BP 118/82 (BP Location: Left Arm, Patient Position: Sitting, Cuff Size: Small)  Pulse (!) 59    Temp 97.8 F (36.6 C) (Oral)    Resp 16    Ht 5\' 5"  (1.651 m)    Wt 195 lb 2 oz (88.5 kg)    SpO2 98%    BMI 32.47 kg/m  General: Well developed, NAD, BMI noted Neck: No  thyromegaly  HEENT:  Normocephalic . Face symmetric, atraumatic Lungs:  CTA B Normal respiratory effort, no intercostal retractions, no accessory muscle use. Heart: RRR,  no murmur.  Abdomen:  Not distended, soft, non-tender. No rebound or rigidity.   Lower extremities: no pretibial edema bilaterally  Skin: Exposed areas without rash. Not pale. Not jaundice Neurologic:  alert & oriented X3.  Speech normal, gait appropriate for age and unassisted Strength symmetric and appropriate for age.  Psych: Cognition and judgment appear intact.  Cooperative with normal attention span and concentration.  Behavior appropriate. No anxious or depressed appearing.     Assessment    Assessment  Hyperglycemia A1c 6.0 01-2018 Hyperlipidemia Anxiety, depression GYN: Postmenopausal H/oh endometriosis. s/p exploration , DUB, endometrial ablasion H/o actinic keratosis, sees  derm q year as of 05/2018 Pulmonary nodules: Last CT 10-2020, stable.  No further scheduled CT  PLAN Here for CPX Hyperglycemia: Check A1c Hyperlipidemia: Last LDL 138, was rec to increase atorvastatin to 20 mg, patient elected to increase to 15 mg and watch her lifestyle however she has not been able to eat healthier or increase exercise.  Check FLP. Anxiety, depression: On sertraline, well controlled Obesity: Grau discussion about diet, exercise, calorie counting, consider visit to the wellness clinic. RTC 6 months   This visit occurred during the SARS-CoV-2 public health emergency.  Safety protocols were in place, including screening questions prior to the visit, additional usage of staff PPE, and extensive cleaning of exam room while observing appropriate contact time as indicated for disinfecting solutions.

## 2022-01-09 MED ORDER — ATORVASTATIN CALCIUM 20 MG PO TABS: 20.0000 mg | ORAL_TABLET | Freq: Every day | ORAL | 1 refills | Status: DC

## 2022-01-09 NOTE — Addendum Note (Signed)
Addended byDamita Dunnings D on: 01/09/2022 08:04 AM   Modules accepted: Orders

## 2022-01-15 LAB — HM PAP SMEAR: HM Pap smear: NEGATIVE

## 2022-01-21 NOTE — Progress Notes (Signed)
Adrienne Burke [address] [place] Phone: [phone] Subjective:   IVilma Burke, am serving as a scribe for Dr. Hulan Saas. This visit occurred during the SARS-CoV-2 public health emergency.  Safety protocols were in place, including screening questions prior to the visit, additional usage of staff PPE, and extensive cleaning of exam room while observing appropriate contact time as indicated for disinfecting solutions.   I'm seeing this patient by the request  of:  Adrienne Branch, MD  CC: Right shoulder pain follow-up  [id number]  12/11/2021 Morbid bursitis.  Injected the subacromial space.  Does have some mild degenerative changes of the shoulder noted of the supraspinatus but likely will do relatively well.  Discussed icing regimen and home exercises, discussed which activities to do to avoid.  Increase activity slowly.  Follow-up again in 6 to 8 weeks.  Patient given injection and tolerated the procedure well, discussed icing regimen and home exercises.  Discussed which activities to do which wants to avoid.  Increase activity slowly.  Follow-up again in 6 to 8 weeks.  Update 01/22/2022 Adrienne Burke is a 61 y.o. female coming in with complaint of R shoulder pain. Patient states has gotten better, Injection helped. Pain is gone. Still popping, but not like before. No new complaints.  Xray R shoulder 12/11/2021 IMPRESSION: No significant radiographic abnormality is seen in the right shoulder.   Degenerative changes are noted in the right [facility] joint with bony spurs.     Past Medical History:  Diagnosis Date   Actinic keratitis    hx of   Anxiety and depression    Atrophic vaginitis    Adrienne polyp    Hyperplastic polyps 10/2004, Normal Cscope 2010    ENDOMETRIOSIS    Genital HSV    HYPERLIPIDEMIA    Insomnia    Ovarian cyst    bilateral, s/p drainage   Past Surgical History:  Procedure Laterality Date   ANKLE FRACTURE  SURGERY     APPENDECTOMY     BREAST IMPLANT EXCHANGE     breast lift-implant 2009, repeated 08-2009     COLONOSCOPY  2010,2015   EYE SURGERY     [place] for vision correction 02-2012   POLYPECTOMY  2015   adenoma polyp   TONSILLECTOMY AND ADENOIDECTOMY  1966   TUBAL LIGATION     uterine ablation     Social History   Socioeconomic History   Marital status: Married    Spouse name: Not on file   Number of children: 2   Years of education: Not on file   Highest education level: Not on file  Occupational History   Occupation: pay roll-tax analyst  Tobacco Use   Smoking status: Former    Packs/day: 0.50    Years: 15.00    Pack years: 7.50    Types: Cigarettes    Start date: 81    Quit date: 1996    Years since quitting: 27.1   Smokeless tobacco: Never   Tobacco comments:    quit 1997  Vaping Use   Vaping Use: Never used  Substance and Sexual Activity   Alcohol use: Yes    Alcohol/week: 1.0 standard drink    Types: 1 Standard drinks or equivalent per week    Comment: socially    Drug use: No   Sexual activity: Not on file  Other Topics Concern   Not on file  Social History Narrative   G3 P2 ab 1  Married to [place] , 1 child alive , lost one            Social Determinants of Radio broadcast assistant Strain: Not on file  Food Insecurity: Not on file  Transportation Needs: Not on file  Physical Activity: Not on file  Stress: Not on file  Social Connections: Not on file   Allergies  Allergen Reactions   Codeine Phosphate     REACTION: unspecified   Penicillins     REACTION: unspecified   Sulfonamide Derivatives     REACTION: unspecified   Family History  Problem Relation Age of Onset   Cancer - Lung Mother        smoker   Skin cancer Mother    COPD Mother    Cancer - Lung Father        smoker   Heart disease Maternal Grandmother    Heart disease Maternal Grandfather    Heart disease Paternal Grandmother    Heart disease Paternal Grandfather     Adrienne cancer Paternal Grandfather 62   Breast cancer Neg Hx    Adrienne polyps Neg Hx    Esophageal cancer Neg Hx    Rectal cancer Neg Hx    Stomach cancer Neg Hx      Current Outpatient Medications (Cardiovascular):    atorvastatin (LIPITOR) 20 MG tablet, Take 1 tablet (20 mg total) by mouth at bedtime.     Current Outpatient Medications (Other):    LORazepam (ATIVAN) 0.5 MG tablet, TAKE 1 TABLET BY MOUTH AT BEDTIME AS NEEDED FOR ANXIETY   Multiple Vitamins-Minerals (MULTIVITAMIN,TX-MINERALS) tablet, Take 1 tablet by mouth daily.     sertraline (ZOLOFT) 50 MG tablet, Take 1 tablet (50 mg total) by mouth daily.    Objective  Blood pressure 108/66, pulse 94, height 5\' 5"  (1.651 m), weight 194 lb (88 kg), SpO2 97 %.   General: No apparent distress alert and oriented x3 mood and affect normal, dressed appropriately.  HEENT: Pupils equal, extraocular movements intact  Respiratory: Patient's speak in full sentences and does not appear short of breath  Cardiovascular: No lower extremity edema, non tender, no erythema  Gait normal with good balance and coordination.  MSK: Right shoulder exam does have good range of motion.  No significant impingement noted today.  Patient has negative crossover today.   Limited muscular skeletal ultrasound was performed and interpreted by Hulan Saas, M  Limited ultrasound of patient's shoulder still shows the patient has hypoechoic changes that is consistent with a subacromial bursitis.  In addition to this patient still has some chronic mild arthritic changes of the acromioclavicular joint but no effusion noted today. Impression: Interval improvement but continued some degenerative changes of the supraspinatus as well as the subacromial bursitis   Impression and Recommendations:     The above documentation has been reviewed and is accurate and complete Lyndal Pulley, DO

## 2022-01-22 ENCOUNTER — Encounter: Payer: Self-pay | Admitting: Family Medicine

## 2022-01-22 ENCOUNTER — Ambulatory Visit: Payer: Self-pay

## 2022-01-22 ENCOUNTER — Ambulatory Visit (INDEPENDENT_AMBULATORY_CARE_PROVIDER_SITE_OTHER): Payer: Commercial Managed Care - PPO | Admitting: Family Medicine

## 2022-01-22 ENCOUNTER — Other Ambulatory Visit: Payer: Self-pay

## 2022-01-22 VITALS — BP 108/66 | HR 94 | Ht 65.0 in | Wt 194.0 lb

## 2022-01-22 DIAGNOSIS — M7551 Bursitis of right shoulder: Secondary | ICD-10-CM | POA: Diagnosis not present

## 2022-01-22 DIAGNOSIS — M19011 Primary osteoarthritis, right shoulder: Secondary | ICD-10-CM

## 2022-01-22 NOTE — Assessment & Plan Note (Signed)
With patient does still have some bursitis noted.  Seems to be more in the subacromial area.  I do still think there is a possibility for some very small degenerative changes of the supraspinatus tendon.  Patient has no significant weakness noted.  Feeling 95% better.  Patient would like to start increasing her activity at the gym and we discussed keeping hands within peripheral vision.  Patient wants to follow-up with me only on an as-needed basis.  Did discuss any worsening weakness or pain to call us immediately.

## 2022-01-22 NOTE — Assessment & Plan Note (Signed)
Significant improvement on ultrasound today.

## 2022-02-28 ENCOUNTER — Other Ambulatory Visit: Payer: Self-pay | Admitting: Internal Medicine

## 2022-03-28 ENCOUNTER — Ambulatory Visit
Admission: RE | Admit: 2022-03-28 | Discharge: 2022-03-28 | Disposition: A | Discharge status: Home / Self Care | Payer: Commercial Managed Care - PPO | Source: Ambulatory Visit | Attending: Obstetrics and Gynecology | Admitting: Obstetrics and Gynecology

## 2022-03-28 DIAGNOSIS — Z1231 Encounter for screening mammogram for malignant neoplasm of breast: Secondary | ICD-10-CM

## 2022-04-10 ENCOUNTER — Encounter: Payer: Self-pay | Admitting: Internal Medicine

## 2022-04-10 ENCOUNTER — Telehealth: Payer: Self-pay | Admitting: Internal Medicine

## 2022-04-11 NOTE — Telephone Encounter (Signed)
PDMP okay, Rx sent 

## 2022-04-11 NOTE — Telephone Encounter (Signed)
Requesting: lorazepam 0.'5mg'$   ?Contract: 10/10/20 ?UDS: 10/10/20 ?Last Visit: 01/07/22 ?Next Visit: 07/09/22 ?Last Refill: 10/14/2021 #30 and 5RF ? ?Will get updated UDS and contract at next visit  ? ?Please Advise ? ?

## 2022-04-11 NOTE — Telephone Encounter (Signed)
Form completed and faxed to [facility] at [phone]. Form sent for scanning.  ?

## 2022-07-09 ENCOUNTER — Ambulatory Visit (INDEPENDENT_AMBULATORY_CARE_PROVIDER_SITE_OTHER): Payer: Commercial Managed Care - PPO | Admitting: Internal Medicine

## 2022-07-09 ENCOUNTER — Encounter: Payer: Self-pay | Admitting: Internal Medicine

## 2022-07-09 VITALS — BP 126/70 | HR 73 | Temp 97.9°F | Resp 16 | Ht 65.0 in | Wt 195.2 lb

## 2022-07-09 DIAGNOSIS — R739 Hyperglycemia, unspecified: Secondary | ICD-10-CM

## 2022-07-09 DIAGNOSIS — Z79899 Other long term (current) drug therapy: Secondary | ICD-10-CM

## 2022-07-09 DIAGNOSIS — E6609 Other obesity due to excess calories: Secondary | ICD-10-CM

## 2022-07-09 DIAGNOSIS — F419 Anxiety disorder, unspecified: Secondary | ICD-10-CM

## 2022-07-09 DIAGNOSIS — E785 Hyperlipidemia, unspecified: Secondary | ICD-10-CM

## 2022-07-09 DIAGNOSIS — Z6832 Body mass index (BMI) 32.0-32.9, adult: Secondary | ICD-10-CM

## 2022-07-09 DIAGNOSIS — F32A Depression, unspecified: Secondary | ICD-10-CM

## 2022-07-09 LAB — LIPID PANEL
Cholesterol: 207 mg/dL — ABNORMAL HIGH (ref 0–200)
HDL: 54 mg/dL (ref >39.00)
LDL Cholesterol: 124 mg/dL — ABNORMAL HIGH (ref 0–99)
NonHDL: 152.65
Total CHOL/HDL Ratio: 4
Triglycerides: 144 mg/dL (ref 0.0–149.0)
VLDL: 28.8 mg/dL (ref 0.0–40.0)

## 2022-07-09 LAB — HEMOGLOBIN A1C: Hgb A1c MFr Bld: 6.3 % (ref 4.6–6.5)

## 2022-07-09 NOTE — Patient Instructions (Addendum)
Recommend to proceed with covid booster (bivalent) at your pharmacy. Flu shot this fall.   Consider to get NOOM program that can help you change your lifestyle    GO TO THE LAB : Get the blood work     [place], [place] back for   a physical exam by 12-2022

## 2022-07-09 NOTE — Progress Notes (Signed)
Subjective:    Patient ID: Adrienne Burke, female    DOB: 19-Nov-1961, 61 y.o.   MRN: [id number]  DOS:  07/09/2022 Type of visit - description: rov  Since the last office visit is doing okay. Had some stress from work but that is better. Has been trying to eat healthier on and off.  Wt Readings from Last 3 Encounters:  07/09/22 195 lb 4 oz (88.6 kg)  01/22/22 194 lb (88 kg)  01/07/22 195 lb 2 oz (88.5 kg)     Review of Systems See above   Past Medical History:  Diagnosis Date   Actinic keratitis    hx of   Anxiety and depression    Atrophic vaginitis    Colon polyp    Hyperplastic polyps 10/2004, Normal Cscope 2010    ENDOMETRIOSIS    Genital HSV    HYPERLIPIDEMIA    Insomnia    Ovarian cyst    bilateral, s/p drainage    Past Surgical History:  Procedure Laterality Date   ANKLE FRACTURE SURGERY     APPENDECTOMY     BREAST IMPLANT EXCHANGE     breast lift-implant 2009, repeated 08-2009     COLONOSCOPY  2010,2015   EYE SURGERY     [place] for vision correction 02-2012   POLYPECTOMY  2015   adenoma polyp   TONSILLECTOMY AND ADENOIDECTOMY  1966   TUBAL LIGATION     uterine ablation      Current Outpatient Medications  Medication Instructions   atorvastatin (LIPITOR) 20 mg, Oral, Daily at bedtime   LORazepam (ATIVAN) 0.5 MG tablet TAKE 1 TABLET BY MOUTH AT BEDTIME AS NEEDED FOR ANXIETY   Multiple Vitamins-Minerals (MULTIVITAMIN,TX-MINERALS) tablet 1 tablet, Daily   sertraline (ZOLOFT) 50 MG tablet TAKE 1 TABLET BY MOUTH EVERY DAY       Objective:   Physical Exam BP 126/70   Pulse 73   Temp 97.9 F (36.6 C) (Oral)   Resp 16   Ht '5\' 5"'$  (1.651 m)   Wt 195 lb 4 oz (88.6 kg)   LMP  (LMP Unknown)   SpO2 91%   BMI 32.49 kg/m  General:   Well developed, NAD, BMI noted. HEENT:  Normocephalic . Face symmetric, atraumatic Lungs:  CTA B Normal respiratory effort, no intercostal retractions, no accessory muscle use. Heart: RRR,  no murmur.  Lower  extremities: no pretibial edema bilaterally  Skin: Not pale. Not jaundice Neurologic:  alert & oriented X3.  Speech normal, gait appropriate for age and unassisted Psych--  Cognition and judgment appear intact.  Cooperative with normal attention span and concentration.  Behavior appropriate. No anxious or depressed appearing.      Assessment    Assessment  Hyperglycemia A1c 6.0 01-2018 Hyperlipidemia Anxiety, depression  GYN: Postmenopausal H/oh endometriosis. s/p exploration , DUB, endometrial ablasion H/o actinic keratosis, sees derm q year as of 05/2018 Pulmonary nodules: Last CT 10-2020, stable.  No further scheduled CT  PLAN Hyperglycemia, patient is concerned about the issue, we review her previous A1c's, they have been stable.  Recheck today Hyperlipidemia: Lipitor was increased to 20 mg based on last FLP.  Good compliance and tolerance.  Check FLP Obesity: BMI 32, she is somewhat frustrated because she is eating healthier on and off and not able to lose weight.  We had a [place] conversation about it, consider NOOM. Anxiety depression; Stress at work was high but is now better.  On sertraline, does not like to change dosing.  On  Ativan as needed, UDS and contract today. Preventive care: Rec flu and COVID booster. RTC CPX 5 months

## 2022-07-09 NOTE — Assessment & Plan Note (Signed)
Hyperglycemia, patient is concerned about the issue, we review her previous A1c's, they have been stable.  Recheck today Hyperlipidemia: Lipitor was increased to 20 mg based on last FLP.  Good compliance and tolerance.  Check FLP Obesity: BMI 32, she is somewhat frustrated because she is eating healthier on and off and not able to lose weight.  We had a [place] conversation about it, consider NOOM. Anxiety depression; Stress at work was high but is now better.  On sertraline, does not like to change dosing.  On Ativan as needed, UDS and contract today. Preventive care: Rec flu and COVID booster. RTC CPX 5 months

## 2022-07-10 ENCOUNTER — Encounter: Payer: Self-pay | Admitting: Internal Medicine

## 2022-07-14 LAB — DRUG MONITORING PANEL 375977 , URINE
Alcohol Metabolites: NEGATIVE ng/mL (ref <500)
Alphahydroxyalprazolam: NEGATIVE ng/mL (ref <25)
Alphahydroxymidazolam: NEGATIVE ng/mL (ref <50)
Alphahydroxytriazolam: NEGATIVE ng/mL (ref <50)
Aminoclonazepam: NEGATIVE ng/mL (ref <25)
Amphetamines: NEGATIVE ng/mL (ref <500)
Barbiturates: NEGATIVE ng/mL (ref <300)
Benzodiazepines: POSITIVE ng/mL — AB (ref <100)
Cocaine Metabolite: NEGATIVE ng/mL (ref <150)
Desmethyltramadol: NEGATIVE ng/mL (ref <100)
Hydroxyethylflurazepam: NEGATIVE ng/mL (ref <50)
Lorazepam: 396 ng/mL — ABNORMAL HIGH (ref <50)
Marijuana Metabolite: NEGATIVE ng/mL (ref <20)
Nordiazepam: NEGATIVE ng/mL (ref <50)
Opiates: NEGATIVE ng/mL (ref <100)
Oxazepam: NEGATIVE ng/mL (ref <50)
Oxycodone: NEGATIVE ng/mL (ref <100)
Temazepam: NEGATIVE ng/mL (ref <50)
Tramadol: NEGATIVE ng/mL (ref <100)

## 2022-07-14 LAB — DM TEMPLATE

## 2022-07-18 ENCOUNTER — Encounter: Payer: Self-pay | Admitting: Internal Medicine

## 2022-07-22 ENCOUNTER — Telehealth: Payer: Self-pay

## 2022-07-22 MED ORDER — WEGOVY 0.25 MG/0.5ML ~~LOC~~ SOAJ: 0.2500 mg | SUBCUTANEOUS | 0 refills | Status: DC

## 2022-07-22 NOTE — Telephone Encounter (Signed)
PA initiated via Covermymeds; KEY: [id number]. Awaiting determination.

## 2022-07-22 NOTE — Telephone Encounter (Signed)
PA approved. Effective 07/22/22 to 02/17/23.

## 2022-08-02 ENCOUNTER — Other Ambulatory Visit: Payer: Self-pay | Admitting: Internal Medicine

## 2022-08-19 ENCOUNTER — Ambulatory Visit (INDEPENDENT_AMBULATORY_CARE_PROVIDER_SITE_OTHER): Payer: Commercial Managed Care - PPO | Admitting: Family Medicine

## 2022-08-19 ENCOUNTER — Encounter: Payer: Self-pay | Admitting: Family Medicine

## 2022-08-19 VITALS — BP 120/68 | HR 57 | Temp 98.1°F | Resp 18 | Ht 65.0 in | Wt 193.6 lb

## 2022-08-19 DIAGNOSIS — J0140 Acute pansinusitis, unspecified: Secondary | ICD-10-CM | POA: Insufficient documentation

## 2022-08-19 DIAGNOSIS — R42 Dizziness and giddiness: Secondary | ICD-10-CM

## 2022-08-19 DIAGNOSIS — J029 Acute pharyngitis, unspecified: Secondary | ICD-10-CM | POA: Diagnosis not present

## 2022-08-19 LAB — POCT RAPID STREP A (OFFICE): Rapid Strep A Screen: NEGATIVE

## 2022-08-19 LAB — POC COVID19 BINAXNOW: SARS Coronavirus 2 Ag: NEGATIVE

## 2022-08-19 MED ORDER — DOXYCYCLINE HYCLATE 100 MG PO TABS: 100.0000 mg | ORAL_TABLET | Freq: Two times a day (BID) | ORAL | 0 refills | Status: DC

## 2022-08-19 NOTE — Assessment & Plan Note (Signed)
epley manuver If no improvement after tx sinus infection ----  rto

## 2022-08-19 NOTE — Progress Notes (Signed)
Established Patient Office Visit  Subjective   Patient ID: Adrienne Burke, female    DOB: [birth year]  Age: 61 y.o. MRN: [id number]  Chief Complaint  Patient presents with   Sore Throat    X1 week, Pt states having sore throat, headache, ear pain, vertigo,Two negative COVID test yesterday morning and last Friday.    HPI Pt is here c/o sore throat , ha and ear pain with vertigo x 1 week.  She has had 2 neg covid tests,   no fevers.  She has tried advil, tylenol and excedrin and flonase.    + cough -- non productive  Patient Active Problem List   Diagnosis Date Noted   Vertigo 08/19/2022   Acute non-recurrent pansinusitis 08/19/2022   Sore throat 08/19/2022   Arthritis of right acromioclavicular joint 12/11/2021   Subacromial bursitis of right shoulder joint 12/11/2021   Trigger point of shoulder region, right 06/01/2019   PCP NOTES >>>>>>>>>>>>>> 01/27/2018   Obesity 01/27/2018   History of actinic keratosis 12/10/2014   Depression 12/29/2013   Annual physical exam 05/31/2012   Hyperlipidemia 01/20/2008   Anxiety and depression 01/20/2008   ENDOMETRIOSIS 01/20/2008   Past Medical History:  Diagnosis Date   Actinic keratitis    hx of   Anxiety and depression    Atrophic vaginitis    Colon polyp    Hyperplastic polyps 10/2004, Normal Cscope 2010    ENDOMETRIOSIS    Genital HSV    HYPERLIPIDEMIA    Insomnia    Ovarian cyst    bilateral, s/p drainage   Past Surgical History:  Procedure Laterality Date   ANKLE FRACTURE SURGERY     APPENDECTOMY     BREAST IMPLANT EXCHANGE     breast lift-implant 2009, repeated 08-2009     COLONOSCOPY  2010,2015   EYE SURGERY     [place] for vision correction 02-2012   POLYPECTOMY  2015   adenoma polyp   TONSILLECTOMY AND ADENOIDECTOMY  1966   TUBAL LIGATION     uterine ablation     Social History   Tobacco Use   Smoking status: Former    Packs/day: 0.50    Years: 15.00    Total pack years: 7.50    Types: Cigarettes    Start  date: 50    Quit date: 1996    Years since quitting: 27.7   Smokeless tobacco: Never   Tobacco comments:    quit 1997  Vaping Use   Vaping Use: Never used  Substance Use Topics   Alcohol use: Yes    Alcohol/week: 1.0 standard drink of alcohol    Types: 1 Standard drinks or equivalent per week    Comment: socially    Drug use: No   Social History   Socioeconomic History   Marital status: Married    Spouse name: Not on file   Number of children: 2   Years of education: Not on file   Highest education level: Not on file  Occupational History   Occupation: pay roll-tax analyst  Tobacco Use   Smoking status: Former    Packs/day: 0.50    Years: 15.00    Total pack years: 7.50    Types: Cigarettes    Start date: 54    Quit date: 1996    Years since quitting: 27.7   Smokeless tobacco: Never   Tobacco comments:    quit 1997  Vaping Use   Vaping Use: Never used  Substance and Sexual Activity  Alcohol use: Yes    Alcohol/week: 1.0 standard drink of alcohol    Types: 1 Standard drinks or equivalent per week    Comment: socially    Drug use: No   Sexual activity: Not on file  Other Topics Concern   Not on file  Social History Narrative   G3 P2 ab 1     Married to [place] , 1 child alive , lost one            Social Determinants of Radio broadcast assistant Strain: Not on file  Food Insecurity: Not on file  Transportation Needs: Not on file  Physical Activity: Not on file  Stress: Not on file  Social Connections: Not on file  Intimate Partner Violence: Not on file   Family Status  Relation Name Status   Mother  Deceased   Father  Deceased   MGM  (Not Specified)   MGF  (Not Specified)   PGM  (Not Specified)   PGF  (Not Specified)   Neg Hx  (Not Specified)   Family History  Problem Relation Age of Onset   Cancer - Lung Mother        smoker   Skin cancer Mother    COPD Mother    Cancer - Lung Father        smoker   Heart disease Maternal  Grandmother    Heart disease Maternal Grandfather    Heart disease Paternal Grandmother    Heart disease Paternal Grandfather    Colon cancer Paternal Grandfather 75   Breast cancer Neg Hx    Colon polyps Neg Hx    Esophageal cancer Neg Hx    Rectal cancer Neg Hx    Stomach cancer Neg Hx    Allergies  Allergen Reactions   Codeine Phosphate     REACTION: unspecified   Penicillins     REACTION: unspecified   Sulfonamide Derivatives     REACTION: unspecified      Review of Systems  Constitutional:  Negative for fever and malaise/fatigue.  HENT:  Positive for congestion, ear pain and sore throat.   Eyes:  Negative for blurred vision.  Respiratory:  Negative for shortness of breath.   Cardiovascular:  Negative for chest pain, palpitations and leg swelling.  Gastrointestinal:  Negative for abdominal pain, blood in stool and nausea.  Genitourinary:  Negative for dysuria and frequency.  Musculoskeletal:  Negative for falls.  Skin:  Negative for rash.  Neurological:  Positive for dizziness and headaches. Negative for loss of consciousness.  Endo/Heme/Allergies:  Negative for environmental allergies.  Psychiatric/Behavioral:  Negative for depression. The patient is not nervous/anxious.       Objective:     BP 120/68 (BP Location: Left Arm, Patient Position: Sitting, Cuff Size: Large)   Pulse (!) 57   Temp 98.1 F (36.7 C) (Oral)   Resp 18   Ht '5\' 5"'$  (1.651 m)   Wt 193 lb 9.6 oz (87.8 kg)   LMP  (LMP Unknown)   SpO2 97%   BMI 32.22 kg/m  BP Readings from Last 3 Encounters:  08/19/22 120/68  07/09/22 126/70  01/22/22 108/66   Wt Readings from Last 3 Encounters:  08/19/22 193 lb 9.6 oz (87.8 kg)  07/09/22 195 lb 4 oz (88.6 kg)  01/22/22 194 lb (88 kg)   SpO2 Readings from Last 3 Encounters:  08/19/22 97%  07/09/22 91%  01/22/22 97%      Physical Exam Vitals and nursing note  reviewed.  Constitutional:      Appearance: She is well-developed.  HENT:     Head:  Normocephalic and atraumatic.     Right Ear: Tympanic membrane normal.     Left Ear: Tympanic membrane normal.     Nose: Congestion present.     Right Sinus: Maxillary sinus tenderness and frontal sinus tenderness present.     Left Sinus: Maxillary sinus tenderness and frontal sinus tenderness present.  Eyes:     Conjunctiva/sclera: Conjunctivae normal.  Neck:     Thyroid: No thyromegaly.     Vascular: No carotid bruit or JVD.  Cardiovascular:     Rate and Rhythm: Normal rate and regular rhythm.     Heart sounds: Normal heart sounds. No murmur heard. Pulmonary:     Effort: Pulmonary effort is normal. No respiratory distress.     Breath sounds: Normal breath sounds. No wheezing or rales.  Chest:     Chest wall: No tenderness.  Musculoskeletal:     Cervical back: Normal range of motion and neck supple.  Neurological:     Mental Status: She is alert and oriented to person, place, and time.    Strep and covid test negative   Results for orders placed or performed in visit on 08/19/22  POC COVID-19  Result Value Ref Range   SARS Coronavirus 2 Ag Negative Negative  POCT rapid strep A  Result Value Ref Range   Rapid Strep A Screen Negative Negative    Last CBC Lab Results  Component Value Date   WBC 5.7 01/07/2022   HGB 12.8 01/07/2022   HCT 39.2 01/07/2022   MCV 92.4 01/07/2022   MCH 31.0 10/10/2020   RDW 13.6 01/07/2022   PLT 276.0 29/79/8921   Last metabolic panel Lab Results  Component Value Date   GLUCOSE 90 01/07/2022   NA 136 01/07/2022   K 4.5 01/07/2022   CL 101 01/07/2022   CO2 29 01/07/2022   BUN 17 01/07/2022   CREATININE 0.79 01/07/2022   GFRNONAA >60 03/08/2019   CALCIUM 9.9 01/07/2022   PROT 7.0 01/07/2022   ALBUMIN 4.4 01/07/2022   BILITOT 0.5 01/07/2022   ALKPHOS 77 01/07/2022   AST 16 01/07/2022   ALT 7 01/07/2022   ANIONGAP 7 03/08/2019   Last lipids Lab Results  Component Value Date   CHOL 207 (H) 07/09/2022   HDL 54.00 07/09/2022    LDLCALC 124 (H) 07/09/2022   LDLDIRECT 164.0 07/16/2011   TRIG 144.0 07/09/2022   CHOLHDL 4 07/09/2022   Last hemoglobin A1c Lab Results  Component Value Date   HGBA1C 6.3 07/09/2022   Last thyroid functions Lab Results  Component Value Date   TSH 2.29 01/07/2022   Last vitamin D No results found for: "25OHVITD2", "25OHVITD3", "VD25OH" Last vitamin B12 and Folate No results found for: "VITAMINB12", "FOLATE"    The 10-year ASCVD risk score (Arnett DK, et al., 2019) is: 2.7%    Assessment & Plan:   Problem List Items Addressed This Visit       Unprioritized   Vertigo    epley manuver If no improvement after tx sinus infection ----  rto       Sore throat   Relevant Orders   POC COVID-19 (Completed)   POCT rapid strep A (Completed)   Acute non-recurrent pansinusitis - Primary    abx per orders Pt did not want a nasal spray She will call if abx not working  Relevant Medications   doxycycline (VIBRA-TABS) 100 MG tablet    Return if symptoms worsen or fail to improve.    Ann Held, DO

## 2022-08-19 NOTE — Assessment & Plan Note (Signed)
abx per orders Pt did not want a nasal spray She will call if abx not working

## 2022-08-19 NOTE — Patient Instructions (Signed)
How to Perform the Epley Maneuver The Epley maneuver is an exercise that relieves symptoms of vertigo. Vertigo is the feeling that you or your surroundings are moving when they are not. When you feel vertigo, you may feel like the room is spinning and may have trouble walking. The Epley maneuver is used for a type of vertigo caused by a calcium deposit in a part of the inner ear. The maneuver involves changing head positions to help the deposit move out of the area. You can do this maneuver at home whenever you have symptoms of vertigo. You can repeat it in 24 hours if your vertigo has not gone away. Even though the Epley maneuver may relieve your vertigo for a few weeks, it is possible that your symptoms will return. This maneuver relieves vertigo, but it does not relieve dizziness. What are the risks? If it is done correctly, the Epley maneuver is considered safe. Sometimes it can lead to dizziness or nausea that goes away after a short time. If you develop other symptoms--such as changes in vision, weakness, or numbness--stop doing the maneuver and call your health care provider. Supplies needed: A bed or table. A pillow. How to do the Epley maneuver     Sit on the edge of a bed or table with your back straight and your legs extended or hanging over the edge of the bed or table. Turn your head halfway toward the affected ear or side as told by your health care provider. Lie backward quickly with your head turned until you are lying flat on your back. Your head should dangle (head-hanging position). You may want to position a pillow under your shoulders. Hold this position for at least 30 seconds. If you feel dizzy or have symptoms of vertigo, continue to hold the position until the symptoms stop. Turn your head to the opposite direction until your unaffected ear is facing down. Your head should continue to dangle. Hold this position for at least 30 seconds. If you feel dizzy or have symptoms of  vertigo, continue to hold the position until the symptoms stop. Turn your whole body to the same side as your head so that you are positioned on your side. Your head will now be nearly facedown and no longer needs to dangle. Hold for at least 30 seconds. If you feel dizzy or have symptoms of vertigo, continue to hold the position until the symptoms stop. Sit back up. You can repeat the maneuver in 24 hours if your vertigo does not go away. Follow these instructions at home: For 24 hours after doing the Epley maneuver: Keep your head in an upright position. When lying down to sleep or rest, keep your head raised (elevated) with two or more pillows. Avoid excessive neck movements. Activity Do not drive or use machinery if you feel dizzy. After doing the Epley maneuver, return to your normal activities as told by your health care provider. Ask your health care provider what activities are safe for you. General instructions Drink enough fluid to keep your urine pale yellow. Do not drink alcohol. Take over-the-counter and prescription medicines only as told by your health care provider. Keep all follow-up visits. This is important. Preventing vertigo symptoms Ask your health care provider if there is anything you should do at home to prevent vertigo. He or she may recommend that you: Keep your head elevated with two or more pillows while you sleep. Do not sleep on the side of your affected ear. Get   up slowly from bed. Avoid sudden movements during the day. Avoid extreme head positions or movement, such as looking up or bending over. Contact a health care provider if: Your vertigo gets worse. You have other symptoms, including: Nausea. Vomiting. Headache. Get help right away if you: Have vision changes. Have a headache or neck pain that is severe or getting worse. Cannot stop vomiting. Have new numbness or weakness in any part of your body. These symptoms may represent a serious problem  that is an emergency. Do not wait to see if the symptoms will go away. Get medical help right away. Call your local emergency services (911 in the [place] Do not drive yourself to the hospital. Summary Vertigo is the feeling that you or your surroundings are moving when they are not. The Epley maneuver is an exercise that relieves symptoms of vertigo. If the Epley maneuver is done correctly, it is considered safe. This information is not intended to replace advice given to you by your health care provider. Make sure you discuss any questions you have with your health care provider. Document Revised: 10/24/2020 Document Reviewed: 10/24/2020 Elsevier Patient Education  2023 Elsevier Inc.  

## 2022-08-27 IMAGING — CT CT CHEST W/O CM
2 of 3 series · 15 of 36 positions shown, 18 images · non-contrast
Comparison: October 19, 2019.

CLINICAL DATA: Lung nodules.

EXAM:
CT CHEST WITHOUT CONTRAST
TECHNIQUE: Multidetector CT imaging of the chest was performed following the
standard protocol without IV contrast.

[Series 2: thorax · axial · 0.69mm/px · z∈[-325,-53]mm · 12 of 160 slices shown, 15 images]
[im 12/160  mediastinal]
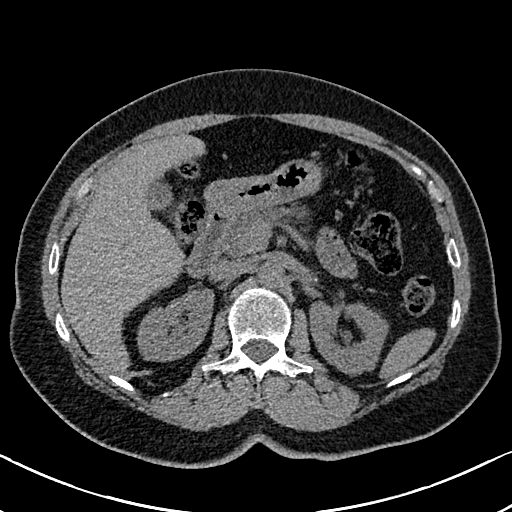
[im 12/160  lung]
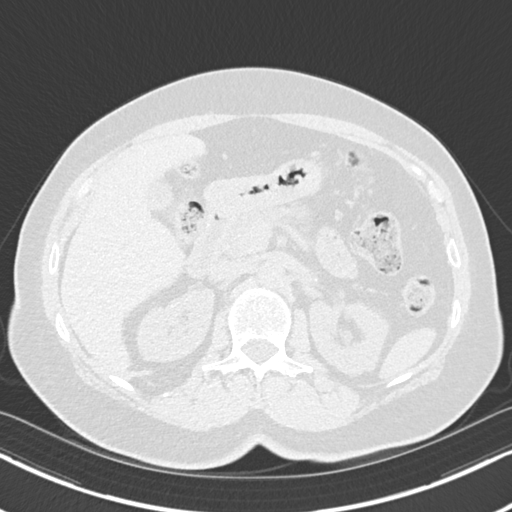
[im 24/160  lung]
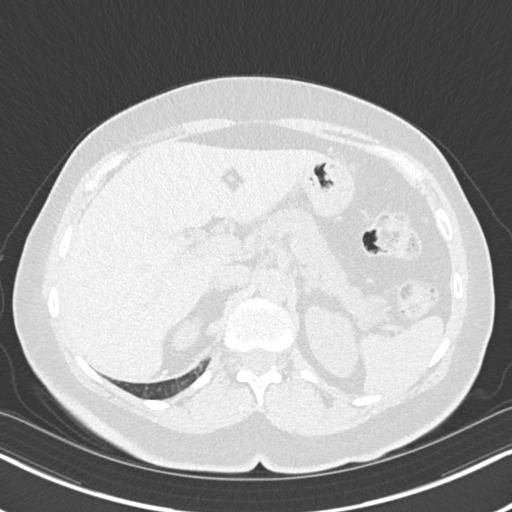
[im 36/160  lung]
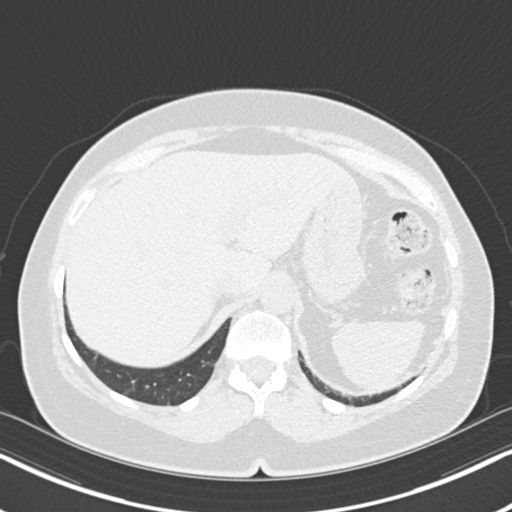
[im 48/160  lung]
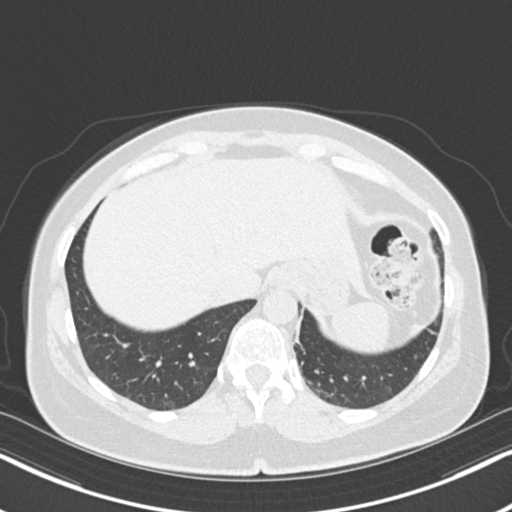
[im 59/160  mediastinal]
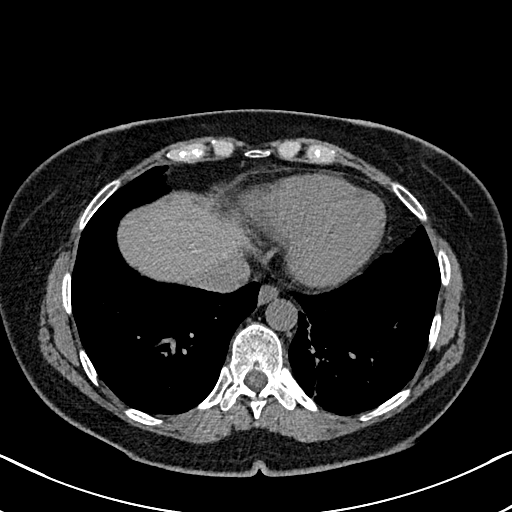
[im 59/160  lung]
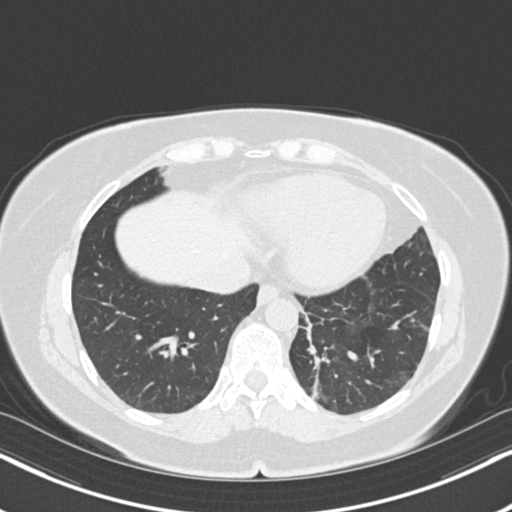
[im 71/160  lung]
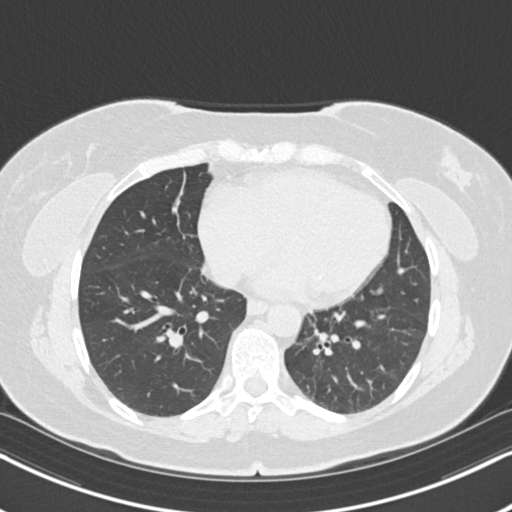
[im 89/160  lung]
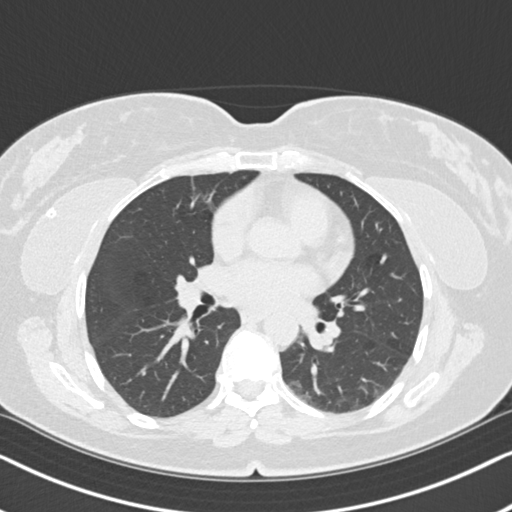
[im 101/160  lung]
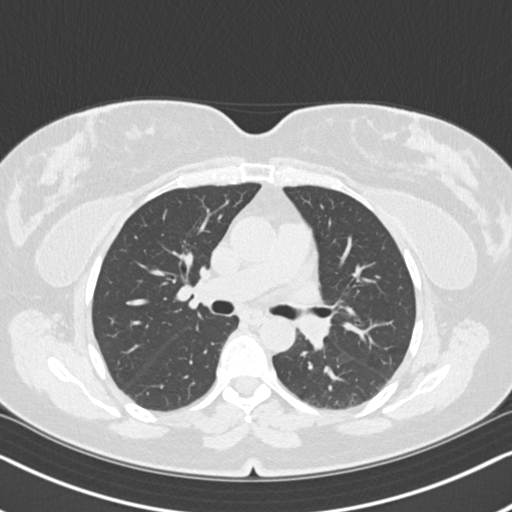
[im 112/160  mediastinal]
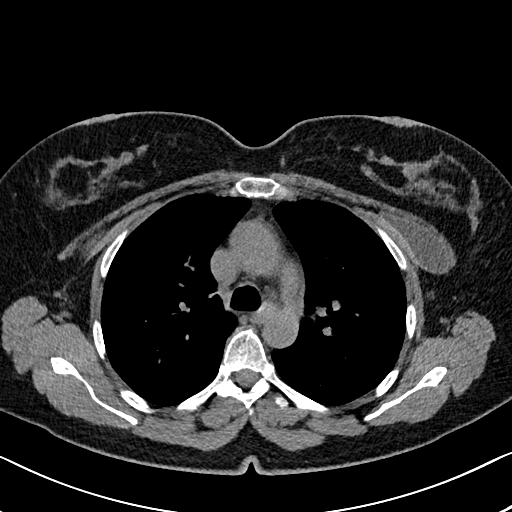
[im 112/160  lung]
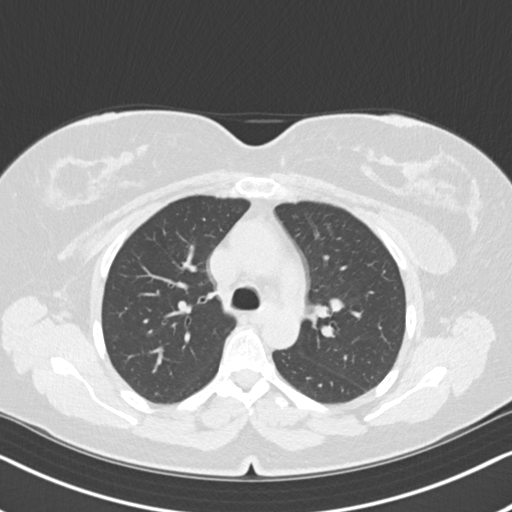
[im 124/160  lung]
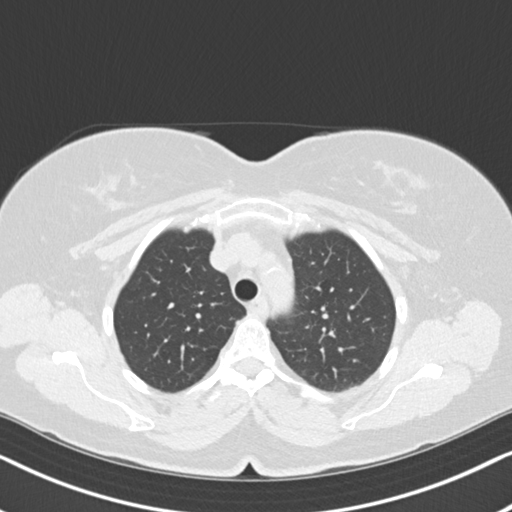
[im 136/160  lung]
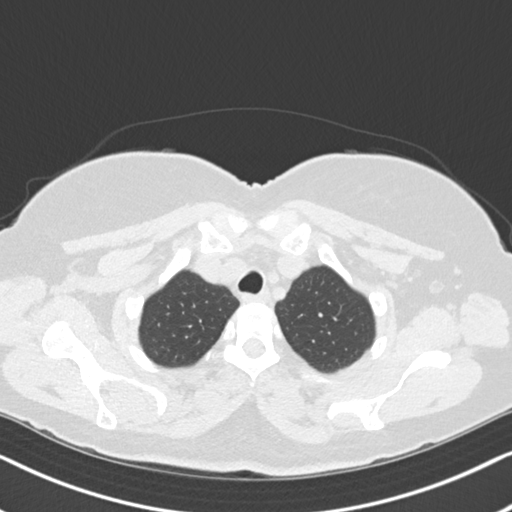
[im 148/160  lung]
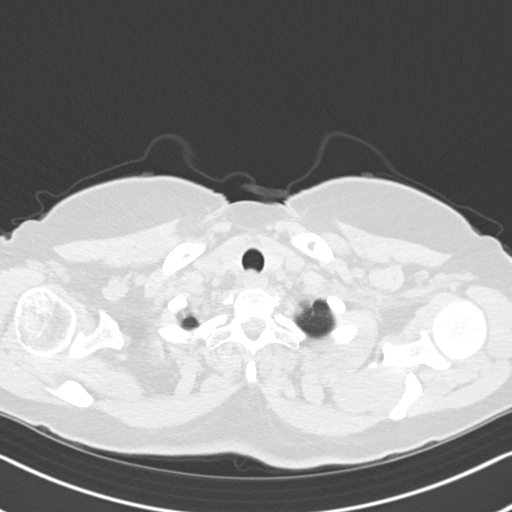

[Series 5: coronal · coronal · 0.65mm/px · 3 of 137 slices shown]
[im 28/137  lung]
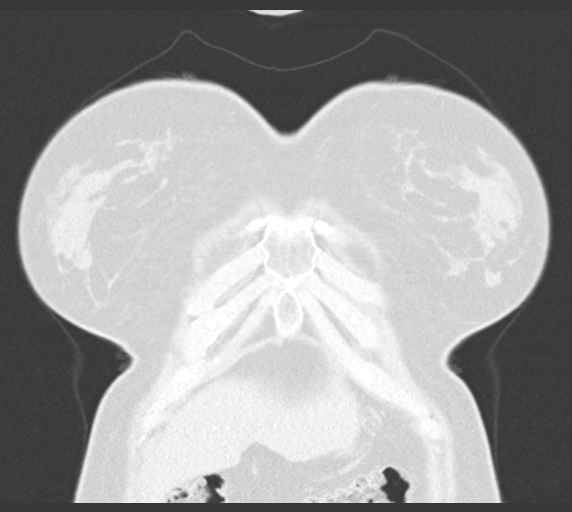
[im 55/137  lung]
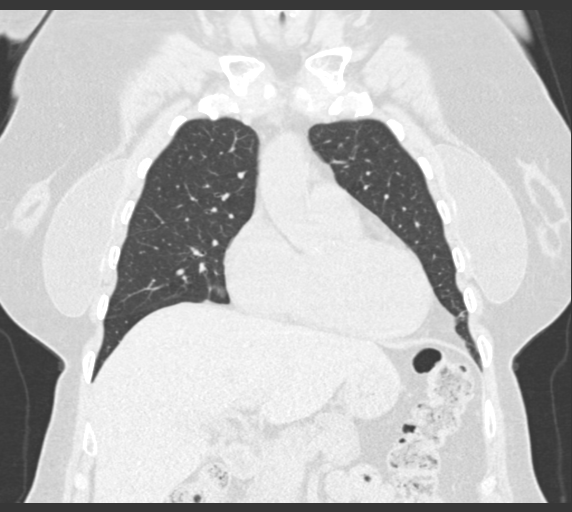
[im 82/137  lung]
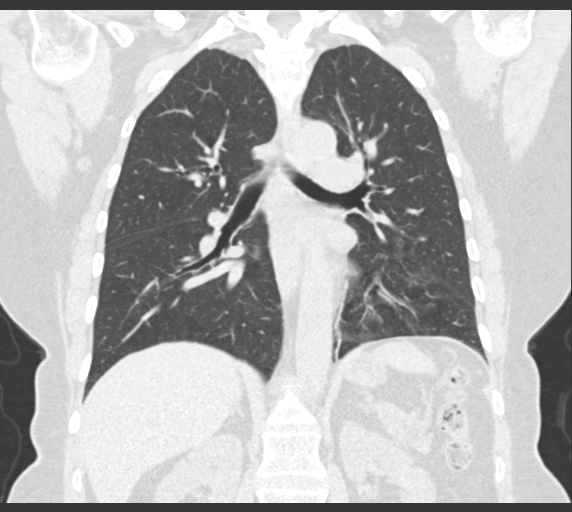

[15 of 36 positions shown; findings below may reference images not displayed]

FINDINGS: Cardiovascular: No significant vascular findings. Normal heart size.
No pericardial effusion.

Mediastinum/Nodes: No enlarged mediastinal or axillary lymph nodes.
Thyroid gland, trachea, and esophagus demonstrate no significant
findings.

Lungs/Pleura: No pneumothorax or pleural effusion is noted. Stable
mild left basilar scarring is noted. Stable 2 mm subpleural nodule
is noted in right lung apex which can be considered benign at this
point with no further follow-up required. Stable scarring is seen in
right middle lobe. No definite acute abnormality is noted.

Upper Abdomen: No acute abnormality.

Musculoskeletal: No chest wall mass or suspicious bone lesions
identified.
IMPRESSION: 1. Stable 2 mm right apical nodule is noted. This can be considered
benign at this point with no further follow-up required
2. Stable mild left basilar scarring is noted.
3. Stable scarring is seen in right middle lobe.
4. No other significant abnormality seen in the chest.

## 2022-08-29 ENCOUNTER — Encounter: Payer: Self-pay | Admitting: Internal Medicine

## 2022-08-29 MED ORDER — LORAZEPAM 0.5 MG PO TABS: ORAL_TABLET | ORAL | 5 refills | Status: DC

## 2022-08-29 NOTE — Telephone Encounter (Signed)
Requesting: lorazepam 0.'5mg'$   Contract:07/09/22 UDS:07/09/22 Last Visit: 07/09/22 Next Visit: 01/14/23 Last Refill: 04/11/22 #30 and 5RF   Pt had to have Rx transferred while on vacation- this voids the remaining refills.   Please Advise

## 2022-08-29 NOTE — Telephone Encounter (Signed)
PDMP review, Rx sent

## 2022-09-14 ENCOUNTER — Encounter: Payer: Self-pay | Admitting: Internal Medicine

## 2022-09-17 ENCOUNTER — Encounter: Payer: Self-pay | Admitting: Internal Medicine

## 2022-09-17 ENCOUNTER — Ambulatory Visit (INDEPENDENT_AMBULATORY_CARE_PROVIDER_SITE_OTHER): Payer: Commercial Managed Care - PPO | Admitting: Internal Medicine

## 2022-09-17 VITALS — BP 126/68 | HR 65 | Temp 97.6°F | Resp 16 | Ht 65.0 in | Wt 196.0 lb

## 2022-09-17 DIAGNOSIS — E6609 Other obesity due to excess calories: Secondary | ICD-10-CM

## 2022-09-17 DIAGNOSIS — Z6832 Body mass index (BMI) 32.0-32.9, adult: Secondary | ICD-10-CM | POA: Diagnosis not present

## 2022-09-17 MED ORDER — ORLISTAT 60 MG PO CAPS: 60.0000 mg | ORAL_CAPSULE | Freq: Three times a day (TID) | ORAL | 6 refills | Status: DC

## 2022-09-17 NOTE — Progress Notes (Signed)
Subjective:    Patient ID: Adrienne Burke, female    DOB: 03/08/1961, 61 y.o.   MRN: [id number]  DOS:  09/17/2022 Type of visit - description: To discuss weight loss medicine  Patient is trying to do well with diet however she has been unable to lose weight. Is here to discuss pharmacological treatment   Wt Readings from Last 3 Encounters:  09/17/22 196 lb (88.9 kg)  08/19/22 193 lb 9.6 oz (87.8 kg)  07/09/22 195 lb 4 oz (88.6 kg)     Review of Systems See above   Past Medical History:  Diagnosis Date   Actinic keratitis    hx of   Anxiety and depression    Atrophic vaginitis    Colon polyp    Hyperplastic polyps 10/2004, Normal Cscope 2010    ENDOMETRIOSIS    Genital HSV    HYPERLIPIDEMIA    Insomnia    Ovarian cyst    bilateral, s/p drainage    Past Surgical History:  Procedure Laterality Date   ANKLE FRACTURE SURGERY     APPENDECTOMY     BREAST IMPLANT EXCHANGE     breast lift-implant 2009, repeated 08-2009     COLONOSCOPY  2010,2015   EYE SURGERY     [place] for vision correction 02-2012   POLYPECTOMY  2015   adenoma polyp   TONSILLECTOMY AND ADENOIDECTOMY  1966   TUBAL LIGATION     uterine ablation      Current Outpatient Medications  Medication Instructions   atorvastatin (LIPITOR) 20 MG tablet TAKE 1 TABLET BY MOUTH EVERYDAY AT BEDTIME   doxycycline (VIBRA-TABS) 100 mg, Oral, 2 times daily   LORazepam (ATIVAN) 0.5 MG tablet TAKE 1 TABLET BY MOUTH AT BEDTIME AS NEEDED FOR ANXIETY   Multiple Vitamins-Minerals (MULTIVITAMIN,TX-MINERALS) tablet 1 tablet, Daily   sertraline (ZOLOFT) 50 MG tablet TAKE 1 TABLET BY MOUTH EVERY DAY   Wegovy 0.25 mg, Subcutaneous, Weekly       Objective:   Physical Exam BP 126/68   Pulse 65   Temp 97.6 F (36.4 C) (Oral)   Resp 16   Ht '5\' 5"'$  (1.651 m)   Wt 196 lb (88.9 kg)   LMP  (LMP Unknown)   SpO2 97%   BMI 32.62 kg/m  General:   Well developed, NAD, BMI noted. HEENT:  Normocephalic . Face symmetric,  atraumatic Lower extremities: no pretibial edema bilaterally  Skin: Not pale. Not jaundice Neurologic:  alert & oriented X3.  Speech normal, gait appropriate for age and unassisted Psych--  Cognition and judgment appear intact.  Cooperative with normal attention span and concentration.  Behavior appropriate. No anxious or depressed appearing.      Assessment     Assessment  Hyperglycemia A1c 6.0 01-2018 Hyperlipidemia Anxiety, depression  GYN: Postmenopausal H/oh endometriosis. s/p exploration , DUB, endometrial ablasion H/o actinic keratosis, sees derm q year as of 05/2018 Pulmonary nodules: Last CT 10-2020, stable.  No further scheduled CT  PLAN Obesity, inability to lose weight. Unable to get wegovy due to no availability I mentioned before orlistat or Qsymia, she did her own research, would be okay trying orlistat. I also gave her information about our research program at [facility], they use phentermine, she is not interested. We agreed on a trial on orlistat >> how it works, side effects and what to expect discussed with the patient. We had a [place] conversation about diet, I recommend a structured diet such as Noom. I also tried to set  her expectations (was able to lose 2 pounds a month at some point and yet she still felt frustrated; explained slow wt loss is ideal). Also rec a  lifestyle change rather than  a "diet" for few weeks or months. She verbalized understanding.

## 2022-09-17 NOTE — Patient Instructions (Signed)
-   Structure diet such as Noom or weight watchers  - Start orlistat, I send the prescription for low-dose.  Take 1 tablet 3 times a day with fatty meals. If he is working, we could increase the dose.  - Try to exercise regularly

## 2022-09-18 NOTE — Assessment & Plan Note (Signed)
Obesity, inability to lose weight. Unable to get wegovy due to no availability I mentioned before orlistat or Qsymia, she did her own research, would be okay trying orlistat. I also gave her information about our research program at [facility], they use phentermine, she is not interested. We agreed on a trial on orlistat >> how it works, side effects and what to expect discussed with the patient. We had a [place] conversation about diet, I recommend a structured diet such as Noom. I also tried to set her expectations (was able to lose 2 pounds a month at some point and yet she still felt frustrated; explained slow wt loss is ideal). Also rec a  lifestyle change rather than  a "diet" for few weeks or months. She verbalized understanding.

## 2022-10-14 ENCOUNTER — Other Ambulatory Visit: Payer: Self-pay | Admitting: Internal Medicine

## 2023-01-14 ENCOUNTER — Ambulatory Visit: Payer: Commercial Managed Care - PPO | Admitting: Internal Medicine

## 2023-01-16 ENCOUNTER — Telehealth: Payer: Self-pay | Admitting: Internal Medicine

## 2023-01-16 ENCOUNTER — Ambulatory Visit (INDEPENDENT_AMBULATORY_CARE_PROVIDER_SITE_OTHER): Payer: Commercial Managed Care - PPO | Admitting: Internal Medicine

## 2023-01-16 ENCOUNTER — Encounter: Payer: Self-pay | Admitting: Internal Medicine

## 2023-01-16 VITALS — BP 132/82 | HR 70 | Temp 97.7°F | Resp 16 | Ht 65.0 in | Wt 198.1 lb

## 2023-01-16 DIAGNOSIS — Z6832 Body mass index (BMI) 32.0-32.9, adult: Secondary | ICD-10-CM | POA: Diagnosis not present

## 2023-01-16 DIAGNOSIS — R739 Hyperglycemia, unspecified: Secondary | ICD-10-CM

## 2023-01-16 DIAGNOSIS — E785 Hyperlipidemia, unspecified: Secondary | ICD-10-CM | POA: Diagnosis not present

## 2023-01-16 DIAGNOSIS — E6609 Other obesity due to excess calories: Secondary | ICD-10-CM

## 2023-01-16 LAB — LIPID PANEL
Cholesterol: 236 mg/dL — ABNORMAL HIGH (ref 0–200)
HDL: 60.7 mg/dL (ref >39.00)
LDL Cholesterol: 145 mg/dL — ABNORMAL HIGH (ref 0–99)
NonHDL: 175.03
Total CHOL/HDL Ratio: 4
Triglycerides: 151 mg/dL — ABNORMAL HIGH (ref 0.0–149.0)
VLDL: 30.2 mg/dL (ref 0.0–40.0)

## 2023-01-16 LAB — ALT: ALT: 8 U/L (ref 0–35)

## 2023-01-16 LAB — AST: AST: 15 U/L (ref 0–37)

## 2023-01-16 LAB — HEMOGLOBIN A1C: Hgb A1c MFr Bld: 6.2 % (ref 4.6–6.5)

## 2023-01-16 MED ORDER — ZEPBOUND 2.5 MG/0.5ML ~~LOC~~ SOAJ: 2.5000 mg | SUBCUTANEOUS | 1 refills | Status: DC

## 2023-01-16 NOTE — Telephone Encounter (Signed)
PA initiated via Covermymeds; KEY: [id number]. Awaiting determination.

## 2023-01-16 NOTE — Progress Notes (Unsigned)
Subjective:    Patient ID: Adrienne Burke, female    DOB: 1961-01-18, 62 y.o.   MRN: [id number]  DOS:  01/16/2023 Type of visit - description: f/u Since the last office visit, was not able to lose weight, he did try to change her lifestyle temporarily but then other things happening and she could not continue with a healthy diet or exercise. + Stress lately however is getting some better.  Wt Readings from Last 3 Encounters:  01/16/23 198 lb 2 oz (89.9 kg)  09/17/22 196 lb (88.9 kg)  08/19/22 193 lb 9.6 oz (87.8 kg)     Review of Systems See above   Past Medical History:  Diagnosis Date   Actinic keratitis    hx of   Anxiety and depression    Atrophic vaginitis    Colon polyp    Hyperplastic polyps 10/2004, Normal Cscope 2010    ENDOMETRIOSIS    Genital HSV    HYPERLIPIDEMIA    Insomnia    Ovarian cyst    bilateral, s/p drainage    Past Surgical History:  Procedure Laterality Date   ANKLE FRACTURE SURGERY     APPENDECTOMY     BREAST IMPLANT EXCHANGE     breast lift-implant 2009, repeated 08-2009     COLONOSCOPY  2010,2015   EYE SURGERY     [place] for vision correction 02-2012   POLYPECTOMY  2015   adenoma polyp   TONSILLECTOMY AND ADENOIDECTOMY  1966   TUBAL LIGATION     uterine ablation      Current Outpatient Medications  Medication Instructions   atorvastatin (LIPITOR) 20 MG tablet TAKE 1 TABLET BY MOUTH EVERYDAY AT BEDTIME   doxycycline (VIBRA-TABS) 100 mg, Oral, 2 times daily   LORazepam (ATIVAN) 0.5 MG tablet TAKE 1 TABLET BY MOUTH AT BEDTIME AS NEEDED FOR ANXIETY   Multiple Vitamins-Minerals (MULTIVITAMIN,TX-MINERALS) tablet 1 tablet, Daily   orlistat (ALLI) 60 mg, Oral, 3 times daily with meals   sertraline (ZOLOFT) 50 mg, Oral, Daily   Wegovy 0.25 mg, Subcutaneous, Weekly       Objective:   Physical Exam BP 132/82   Pulse 70   Temp 97.7 F (36.5 C) (Oral)   Resp 16   Ht 5' 5"$  (1.651 m)   Wt 198 lb 2 oz (89.9 kg)   LMP  (LMP Unknown)   SpO2  98%   BMI 32.97 kg/m  General:   Well developed, NAD, BMI noted. HEENT:  Normocephalic . Face symmetric, atraumatic Lungs:  CTA B Normal respiratory effort, no intercostal retractions, no accessory muscle use. Heart: RRR,  no murmur.  Lower extremities: no pretibial edema bilaterally  Skin: Not pale. Not jaundice Neurologic:  alert & oriented X3.  Speech normal, gait appropriate for age and unassisted Psych--  Cognition and judgment appear intact.  Cooperative with normal attention span and concentration.  Behavior appropriate. No anxious or depressed appearing.      Assessment     Assessment  Hyperglycemia A1c 6.0 01-2018 Hyperlipidemia Anxiety, depression  GYN: Postmenopausal H/oh endometriosis. s/p exploration , DUB, endometrial ablasion H/o actinic keratosis, sees derm q year as of 05/2018 Pulmonary nodules: Last CT 10-2020, stable.  No further scheduled CT  PLAN Obesity, inability to lose weight, Tried to improve her lifestyle only temporarily, then things happening life and she could not continue.  In addition was not able to get orlistat or Wegovy. Will try Zepbound ,Rx sent. Hyperglycemia: Check A1c Hyperlipidemia, on atorvastatin, check FLP AST  ALT Anxiety depression: On Zoloft, a lot of stress lately but is getting better. Preventive care: Recommend COVID booster RTC 3 months CPX   Obesity, inability to lose weight. Unable to get wegovy due to no availability I mentioned before orlistat or Qsymia, she did her own research, would be okay trying orlistat. I also gave her information about our research program at [facility], they use phentermine, she is not interested. We agreed on a trial on orlistat >> how it works, side effects and what to expect discussed with the patient. We had a [place] conversation about diet, I recommend a structured diet such as Noom. I also tried to set her expectations (was able to lose 2 pounds a month at some point and yet she still felt  frustrated; explained slow wt loss is ideal). Also rec a  lifestyle change rather than  a "diet" for few weeks or months. She verbalized understanding.

## 2023-01-16 NOTE — Telephone Encounter (Signed)
PA closed on covermymeds. Called [facility] at [phone], spoke w/ representative, she states it looked like it was closed because of a duplicate request on their end and also "under review." She placed me on hold to see if she could find out more information. After 15 mins the call disconnected. I will have to try back later.

## 2023-01-16 NOTE — Patient Instructions (Addendum)
Vaccines I recommend:  Covid booster  I sent a prescription for Zepbound, 1 injection weekly.  GO TO THE LAB : Get the blood work     GO TO THE FRONT DESK, [place] Come back for a physical exam in 3 months

## 2023-01-18 NOTE — Assessment & Plan Note (Signed)
Obesity, inability to lose weight, Tried to improve her lifestyle only temporarily, then things happened in her  life and she could not continue.  In addition was not able to get orlistat or Wegovy. Will try Zepbound ,Rx sent. Hyperglycemia: Check A1c Hyperlipidemia, on atorvastatin, check FLP AST ALT Anxiety depression: On Zoloft, a lot of stress lately but is getting better. Preventive care: Recommend COVID booster RTC 3 months CPX

## 2023-01-19 ENCOUNTER — Encounter: Payer: Self-pay | Admitting: Internal Medicine

## 2023-01-19 ENCOUNTER — Other Ambulatory Visit: Payer: Self-pay | Admitting: Internal Medicine

## 2023-01-19 MED ORDER — INSULIN PEN NEEDLE 31G X 5 MM MISC
2 refills | Status: DC
  Filled 2023-01-26: qty 100, 90d supply, fill #0

## 2023-01-19 MED ORDER — ATORVASTATIN CALCIUM 40 MG PO TABS: 40.0000 mg | ORAL_TABLET | Freq: Every day | ORAL | 1 refills | Status: DC

## 2023-01-19 MED ORDER — ATORVASTATIN CALCIUM 20 MG PO TABS: 20.0000 mg | ORAL_TABLET | Freq: Every day | ORAL | Status: DC

## 2023-01-19 MED ORDER — SAXENDA 18 MG/3ML ~~LOC~~ SOPN
PEN_INJECTOR | SUBCUTANEOUS | 1 refills | Status: DC
  Filled 2023-01-26: qty 15, 30d supply, fill #0

## 2023-01-19 NOTE — Addendum Note (Signed)
Addended byDamita Dunnings D on: 01/19/2023 10:36 AM   Modules accepted: Orders

## 2023-01-19 NOTE — Telephone Encounter (Addendum)
Spoke w/ Adrienne Burke at [facility] to check status of PA- plan requires Pt to try and fail 3 out of 3 formulary alternatives:   Pt has tried and failed [employer]. She will need to fail 2 additional.   Saxenda Orlistat Qsymia

## 2023-01-19 NOTE — Telephone Encounter (Signed)
Send Saxenda: 0.6 mg once daily for 1 week, then  increase to 1.2 mg daily.

## 2023-01-19 NOTE — Telephone Encounter (Signed)
Rx sent. Mychart message sent to Pt.

## 2023-01-20 ENCOUNTER — Telehealth: Payer: Self-pay

## 2023-01-20 NOTE — Telephone Encounter (Signed)
PA initiated via Covermymeds; KEY: [id number]. Awaiting determination.

## 2023-01-20 NOTE — Telephone Encounter (Signed)
PA approved. Effective 01/20/2023 to 05/21/2023

## 2023-01-26 ENCOUNTER — Other Ambulatory Visit (HOSPITAL_BASED_OUTPATIENT_CLINIC_OR_DEPARTMENT_OTHER): Payer: Self-pay

## 2023-01-28 ENCOUNTER — Other Ambulatory Visit (HOSPITAL_BASED_OUTPATIENT_CLINIC_OR_DEPARTMENT_OTHER): Payer: Self-pay

## 2023-01-29 NOTE — Addendum Note (Signed)
Addended byDamita Dunnings D on: 01/29/2023 10:45 AM   Modules accepted: Orders

## 2023-01-30 ENCOUNTER — Encounter (HOSPITAL_BASED_OUTPATIENT_CLINIC_OR_DEPARTMENT_OTHER): Payer: Self-pay

## 2023-01-30 ENCOUNTER — Other Ambulatory Visit (HOSPITAL_BASED_OUTPATIENT_CLINIC_OR_DEPARTMENT_OTHER): Payer: Self-pay

## 2023-01-30 MED ORDER — WEGOVY 0.25 MG/0.5ML ~~LOC~~ SOAJ
0.2500 mg | SUBCUTANEOUS | 0 refills | Status: DC
  Filled 2023-01-30: qty 2, 28d supply, fill #0

## 2023-01-30 NOTE — Addendum Note (Signed)
Addended byDamita Dunnings D on: 01/30/2023 07:57 AM   Modules accepted: Orders

## 2023-02-05 ENCOUNTER — Ambulatory Visit (INDEPENDENT_AMBULATORY_CARE_PROVIDER_SITE_OTHER): Payer: Commercial Managed Care - PPO | Admitting: Bariatrics

## 2023-02-05 ENCOUNTER — Encounter: Payer: Self-pay | Admitting: Bariatrics

## 2023-02-05 VITALS — BP 103/66 | HR 66 | Temp 97.6°F | Ht 65.0 in | Wt 192.0 lb

## 2023-02-05 DIAGNOSIS — R739 Hyperglycemia, unspecified: Secondary | ICD-10-CM | POA: Diagnosis not present

## 2023-02-05 DIAGNOSIS — E7849 Other hyperlipidemia: Secondary | ICD-10-CM

## 2023-02-05 DIAGNOSIS — Z6831 Body mass index (BMI) 31.0-31.9, adult: Secondary | ICD-10-CM | POA: Diagnosis not present

## 2023-02-05 DIAGNOSIS — E785 Hyperlipidemia, unspecified: Secondary | ICD-10-CM

## 2023-02-05 DIAGNOSIS — E669 Obesity, unspecified: Secondary | ICD-10-CM | POA: Diagnosis not present

## 2023-02-05 DIAGNOSIS — Z6830 Body mass index (BMI) 30.0-30.9, adult: Secondary | ICD-10-CM | POA: Insufficient documentation

## 2023-02-05 NOTE — Progress Notes (Signed)
Office: [phone]  /  Fax: [phone]   Initial Visit  Adrienne Burke was seen in clinic today to evaluate for obesity. She is interested in losing weight to improve overall health and reduce the risk of weight related complications. She presents today to review program treatment options, initial physical assessment, and evaluation.     She was referred by: PCP  When asked what else they would like to accomplish? She states: Adopt healthier eating patterns and Improve energy levels and physical activity  When asked how has your weight affected you? She states: Having fatigue  Some associated conditions: Hyperlipidemia and Other: Hyperglycemia  Contributing factors: Other: None  Weight promoting medications identified: None  Current nutrition plan: None  Current level of physical activity: None, Walking, and Other: going to the gym  Current or previous pharmacotherapy: GLP-1  Response to medication: Other: Just started [facility] recently.    Past medical history includes:   Past Medical History:  Diagnosis Date   Actinic keratitis    hx of   Anxiety and depression    Atrophic vaginitis    Colon polyp    Hyperplastic polyps 10/2004, Normal Cscope 2010    ENDOMETRIOSIS    Genital HSV    HYPERLIPIDEMIA    Insomnia    Ovarian cyst    bilateral, s/p drainage     Objective:   BP 103/66   Pulse 66   Temp 97.6 F (36.4 C)   Ht '5\' 5"'$  (1.651 m)   Wt 192 lb (87.1 kg)   LMP  (LMP Unknown)   SpO2 96%   BMI 31.95 kg/m  She was weighed on the bioimpedance scale:  , Body Fat%:39.1, Visceral Fat Rating:11, Weight trend over the last 12 months: Unchanged  General:  Alert, oriented and cooperative. Patient is in no acute distress.  Respiratory: Normal respiratory effort, no problems with respiration noted  Extremities: Normal range of motion.    Mental Status: Normal mood and affect. Normal behavior. Normal judgment and thought content.   DIAGNOSTIC DATA  REVIEWED:  BMET    Component Value Date/Time   NA 136 01/07/2022 0921   K 4.5 01/07/2022 0921   CL 101 01/07/2022 0921   CO2 29 01/07/2022 0921   GLUCOSE 90 01/07/2022 0921   BUN 17 01/07/2022 0921   CREATININE 0.79 01/07/2022 0921   CREATININE 0.85 10/10/2020 0922   CALCIUM 9.9 01/07/2022 0921   GFRNONAA >60 03/08/2019 1415   GFRAA >60 03/08/2019 1415   Lab Results  Component Value Date   HGBA1C 6.2 01/16/2023   HGBA1C 6.0 01/26/2018   No results found for: "INSULIN" CBC    Component Value Date/Time   WBC 5.7 01/07/2022 0921   RBC 4.24 01/07/2022 0921   HGB 12.8 01/07/2022 0921   HCT 39.2 01/07/2022 0921   PLT 276.0 01/07/2022 0921   MCV 92.4 01/07/2022 0921   MCH 31.0 10/10/2020 0922   MCHC 32.7 01/07/2022 0921   RDW 13.6 01/07/2022 0921   Iron/TIBC/Ferritin/ %Sat    Component Value Date/Time   IRON 64 04/19/2010 0938   FERRITIN 45.5 04/19/2010 0938   IRONPCTSAT 44.0 02/26/2009 1001   Lipid Panel     Component Value Date/Time   CHOL 236 (H) 01/16/2023 0828   TRIG 151.0 (H) 01/16/2023 0828   HDL 60.70 01/16/2023 0828   CHOLHDL 4 01/16/2023 0828   VLDL 30.2 01/16/2023 0828   LDLCALC 145 (H) 01/16/2023 0828   LDLCALC 136 (H) 10/10/2020 0922   LDLDIRECT  164.0 07/16/2011 0915   Hepatic Function Panel     Component Value Date/Time   PROT 7.0 01/07/2022 0921   ALBUMIN 4.4 01/07/2022 0921   AST 15 01/16/2023 0828   ALT 8 01/16/2023 0828   ALKPHOS 77 01/07/2022 0921   BILITOT 0.5 01/07/2022 0921   BILIDIR 0.1 04/09/2010 1114      Component Value Date/Time   TSH 2.29 01/07/2022 0921     Assessment and Plan:   Hyperlipidemia LDL is not at goal. Medication(s): Lipitor Cardiovascular risk factors: obesity (BMI >= 30 kg/m2)  Lab Results  Component Value Date   CHOL 236 (H) 01/16/2023   HDL 60.70 01/16/2023   LDLCALC 145 (H) 01/16/2023   LDLDIRECT 164.0 07/16/2011   TRIG 151.0 (H) 01/16/2023   CHOLHDL 4 01/16/2023   Lab Results  Component  Value Date   ALT 8 01/16/2023   AST 15 01/16/2023   ALKPHOS 77 01/07/2022   BILITOT 0.5 01/07/2022   The 10-year ASCVD risk score (Arnett DK, et al., 2019) is: 2.1%   Values used to calculate the score:     Age: 62 years     Sex: Female     Is Non-Hispanic African American: No     Diabetic: No     Tobacco smoker: No     Systolic Blood Pressure: [id number] mmHg     Is BP treated: No     HDL Cholesterol: 60.7 mg/dL     Total Cholesterol: 236 mg/dL   Plan:  Continue statin.  Information sheet on healthy vs unhealthy fats.  Will avoid all trans fats.  Will read labels Will minimize saturated fats except the following: low fat meats in moderation, diary, and limited dark chocolate.  6.  Increase Omega 3 in foods, and consider an Omega 3 supplement.     Hyperglycemia  Taking Wegovy per PCP, no other medications. She struggles with sweets.   Continue [id number].  Will minimize all carbohydrates.   Obesity Treatment / Action Plan:  Patient will work on garnering support from family and friends to begin weight loss journey. Will work on eliminating or reducing the presence of highly palatable, calorie dense foods in the home. Will complete provided nutritional and psychosocial assessment questionnaire before the next appointment. Will be scheduled for indirect calorimetry to determine resting energy expenditure in a fasting state.  This will allow [place] to create a reduced calorie, high-protein meal plan to promote loss of fat mass while preserving muscle mass.  Obesity Education Performed Today:  She was weighed on the bioimpedance scale and results were discussed and documented in the synopsis.  We discussed obesity as a disease and the importance of a more detailed evaluation of all the factors contributing to the disease.  We discussed the importance of [place] term lifestyle changes which include nutrition, exercise and behavioral modifications as well as the importance of customizing this  to her specific health and social needs.  We discussed the benefits of reaching a healthier weight to alleviate the symptoms of existing conditions and reduce the risks of the biomechanical, metabolic and psychological effects of obesity.  Adrienne Burke appears to be in the action stage of change and states they are ready to start intensive lifestyle modifications and behavioral modifications.  40 minutes was spent today on this visit including the above counseling, pre-visit chart review, and post-visit documentation.  Reviewed by clinician on day of visit: allergies, medications, problem list, medical history, surgical history, family history, social history, and  previous encounter notes.    Shivali Quackenbush A. Owens Shark D.O.    Office: [phone]  /  Fax: [phone]

## 2023-02-12 ENCOUNTER — Other Ambulatory Visit: Payer: Self-pay | Admitting: Obstetrics and Gynecology

## 2023-02-12 DIAGNOSIS — Z0000 Encounter for general adult medical examination without abnormal findings: Secondary | ICD-10-CM

## 2023-02-18 ENCOUNTER — Other Ambulatory Visit (HOSPITAL_BASED_OUTPATIENT_CLINIC_OR_DEPARTMENT_OTHER): Payer: Self-pay

## 2023-02-18 ENCOUNTER — Telehealth: Payer: Self-pay | Admitting: Internal Medicine

## 2023-02-18 MED ORDER — WEGOVY 0.5 MG/0.5ML ~~LOC~~ SOAJ
0.5000 mg | SUBCUTANEOUS | 0 refills | Status: DC
  Filled 2023-02-18: qty 2, 28d supply, fill #0

## 2023-02-18 NOTE — Addendum Note (Signed)
Addended byDamita Dunnings D on: 02/18/2023 12:23 PM   Modules accepted: Orders

## 2023-02-18 NOTE — Telephone Encounter (Signed)
I had a conversation with a patient regarding the fact that no prior authorizations would be completed for this medication. She was insistent that she did not understand that the prior approval was set to expire, and if she had known, she would not have gone through with this plan.   I shared with her that I told her on February 22 that he agreed to send in the "initial dose of Wegovy and any additional authorizations or refills for this medication must come from a healthy weight and wellness."  The patient shared that she did not understand that again and felt it was unfair. She wanted to talk to someone else since Dr. Larose Kells did not call himself personally. I shared the patient experience phone number with her. I also shared an understanding of her frustration, and I apologized if she did not understand my initial message on February 22. I advised her that she could space her injections out to 10 days to get her through. At that point, she shared with me that she 'wasted' her first dose by dropping it on the floor when she first took it, so she was already short. She stated this after explaining that the "medication worked for her."    She still requested that Dr. Larose Kells complete a prior [place] this medication, and I politely declined and let her know that no additional weight loss medication would be prescribed to her.

## 2023-02-18 NOTE — Telephone Encounter (Signed)
Hi Emillia  On February 21 at 4:31 PM, I messaged you that there was a lot of work that goes into a PA for these medications and that if you were insistent on Wegovy, I could ask him to change you back one last time, but he would refer you to healthy [facility] for any further prescriptions. On February 22, I sent you a message letting you know that we would send that prescription in one time, and then any additional medication would need to come from healthy [facility]. While Dr. Larose Kells would be agreeable to sending in the prescription, this office will not complete the PA for it. It will be up to you to pay out-of-pocket. You should not cancel the appointment with [facility] as Dr. Larose Kells will no longer complete prior authorizations for or assume refills for this medication as he explained to you on February 27.   Thank you,  [place]

## 2023-02-18 NOTE — Telephone Encounter (Signed)
Thank you :)

## 2023-02-18 NOTE — Telephone Encounter (Signed)
Pt called stating that she would like Dr. Larose Kells to give her a call in regards to this matter. Pt stated that she does not want to talk to his nurse. Advised a note would be sent back in regards to this. After disconnecting call, noticed that she had replied to [place] message regarding this matter.

## 2023-02-19 ENCOUNTER — Other Ambulatory Visit (HOSPITAL_BASED_OUTPATIENT_CLINIC_OR_DEPARTMENT_OTHER): Payer: Self-pay

## 2023-02-20 ENCOUNTER — Other Ambulatory Visit (HOSPITAL_BASED_OUTPATIENT_CLINIC_OR_DEPARTMENT_OTHER): Payer: Self-pay

## 2023-02-23 ENCOUNTER — Other Ambulatory Visit (HOSPITAL_BASED_OUTPATIENT_CLINIC_OR_DEPARTMENT_OTHER): Payer: Self-pay

## 2023-02-24 ENCOUNTER — Other Ambulatory Visit (HOSPITAL_BASED_OUTPATIENT_CLINIC_OR_DEPARTMENT_OTHER): Payer: Self-pay

## 2023-02-25 ENCOUNTER — Other Ambulatory Visit (HOSPITAL_BASED_OUTPATIENT_CLINIC_OR_DEPARTMENT_OTHER): Payer: Self-pay

## 2023-02-26 ENCOUNTER — Other Ambulatory Visit (HOSPITAL_BASED_OUTPATIENT_CLINIC_OR_DEPARTMENT_OTHER): Payer: Self-pay

## 2023-02-26 ENCOUNTER — Telehealth: Payer: Self-pay | Admitting: Internal Medicine

## 2023-02-26 NOTE — Telephone Encounter (Signed)
PDMP okay, Rx sent 

## 2023-02-26 NOTE — Telephone Encounter (Signed)
Requesting: lorazepam 0.5mg   Contract: 07/09/22 UDS: 07/09/22 Last Visit: 01/16/23 Next Visit: 05/08/23 Last Refill: 08/29/22 #30 and 5RF  Please Advise

## 2023-03-02 ENCOUNTER — Other Ambulatory Visit (HOSPITAL_BASED_OUTPATIENT_CLINIC_OR_DEPARTMENT_OTHER): Payer: Self-pay

## 2023-03-03 ENCOUNTER — Other Ambulatory Visit (HOSPITAL_BASED_OUTPATIENT_CLINIC_OR_DEPARTMENT_OTHER): Payer: Self-pay

## 2023-03-05 ENCOUNTER — Encounter: Payer: Self-pay | Admitting: Bariatrics

## 2023-03-05 ENCOUNTER — Ambulatory Visit (INDEPENDENT_AMBULATORY_CARE_PROVIDER_SITE_OTHER): Payer: Commercial Managed Care - PPO | Admitting: Bariatrics

## 2023-03-05 ENCOUNTER — Other Ambulatory Visit (HOSPITAL_BASED_OUTPATIENT_CLINIC_OR_DEPARTMENT_OTHER): Payer: Self-pay

## 2023-03-05 VITALS — BP 96/64 | HR 65 | Temp 97.7°F | Ht 65.0 in | Wt 187.0 lb

## 2023-03-05 DIAGNOSIS — E559 Vitamin D deficiency, unspecified: Secondary | ICD-10-CM | POA: Diagnosis not present

## 2023-03-05 DIAGNOSIS — R0602 Shortness of breath: Secondary | ICD-10-CM | POA: Diagnosis not present

## 2023-03-05 DIAGNOSIS — Z1331 Encounter for screening for depression: Secondary | ICD-10-CM

## 2023-03-05 DIAGNOSIS — R7303 Prediabetes: Secondary | ICD-10-CM | POA: Diagnosis not present

## 2023-03-05 DIAGNOSIS — Z6831 Body mass index (BMI) 31.0-31.9, adult: Secondary | ICD-10-CM

## 2023-03-05 DIAGNOSIS — Z0289 Encounter for other administrative examinations: Secondary | ICD-10-CM

## 2023-03-05 DIAGNOSIS — E669 Obesity, unspecified: Secondary | ICD-10-CM

## 2023-03-05 DIAGNOSIS — R5383 Other fatigue: Secondary | ICD-10-CM | POA: Diagnosis not present

## 2023-03-05 DIAGNOSIS — E7849 Other hyperlipidemia: Secondary | ICD-10-CM

## 2023-03-05 DIAGNOSIS — E538 Deficiency of other specified B group vitamins: Secondary | ICD-10-CM

## 2023-03-05 MED ORDER — WEGOVY 0.5 MG/0.5ML ~~LOC~~ SOAJ
0.5000 mg | SUBCUTANEOUS | 0 refills | Status: DC
  Filled 2023-03-05: qty 2, 28d supply, fill #0

## 2023-03-06 ENCOUNTER — Other Ambulatory Visit (HOSPITAL_BASED_OUTPATIENT_CLINIC_OR_DEPARTMENT_OTHER): Payer: Self-pay

## 2023-03-07 LAB — COMPREHENSIVE METABOLIC PANEL
ALT: 6 IU/L (ref 0–32)
AST: 19 IU/L (ref 0–40)
Albumin/Globulin Ratio: 2 (ref 1.2–2.2)
Albumin: 4.7 g/dL (ref 3.8–4.9)
Alkaline Phosphatase: 75 IU/L (ref 44–121)
BUN/Creatinine Ratio: 16 (ref 9–23)
BUN: 14 mg/dL (ref 6–24)
Bilirubin Total: 0.4 mg/dL (ref 0.0–1.2)
CO2: 23 mmol/L (ref 20–29)
Calcium: 9.9 mg/dL (ref 8.7–10.2)
Chloride: 98 mmol/L (ref 96–106)
Creatinine, Ser: 0.87 mg/dL (ref 0.57–1.00)
Globulin, Total: 2.4 g/dL (ref 1.5–4.5)
Glucose: 93 mg/dL (ref 70–99)
Potassium: 4.6 mmol/L (ref 3.5–5.2)
Sodium: 135 mmol/L (ref 134–144)
Total Protein: 7.1 g/dL (ref 6.0–8.5)
eGFR: 77 mL/min/{1.73_m2} (ref >59)

## 2023-03-07 LAB — VITAMIN B12: Vitamin B-12: 830 pg/mL (ref 232–1245)

## 2023-03-07 LAB — INSULIN, RANDOM: INSULIN: 7.1 u[IU]/mL (ref 2.6–24.9)

## 2023-03-07 LAB — LIPID PANEL WITH LDL/HDL RATIO
Cholesterol, Total: 188 mg/dL (ref 100–199)
HDL: 62 mg/dL (ref >39)
LDL Chol Calc (NIH): 110 mg/dL — ABNORMAL HIGH (ref 0–99)
LDL/HDL Ratio: 1.8 ratio (ref 0.0–3.2)
Triglycerides: 91 mg/dL (ref 0–149)
VLDL Cholesterol Cal: 16 mg/dL (ref 5–40)

## 2023-03-07 LAB — TSH+T4F+T3FREE
Free T4: 0.92 ng/dL (ref 0.82–1.77)
T3, Free: 2.6 pg/mL (ref 2.0–4.4)
TSH: 1.96 u[IU]/mL (ref 0.450–4.500)

## 2023-03-07 LAB — VITAMIN D 25 HYDROXY (VIT D DEFICIENCY, FRACTURES): Vit D, 25-Hydroxy: 22.6 ng/mL — ABNORMAL LOW (ref 30.0–100.0)

## 2023-03-09 ENCOUNTER — Other Ambulatory Visit (HOSPITAL_BASED_OUTPATIENT_CLINIC_OR_DEPARTMENT_OTHER): Payer: Self-pay

## 2023-03-10 ENCOUNTER — Telehealth (INDEPENDENT_AMBULATORY_CARE_PROVIDER_SITE_OTHER): Payer: Self-pay | Admitting: Bariatrics

## 2023-03-10 ENCOUNTER — Other Ambulatory Visit (HOSPITAL_BASED_OUTPATIENT_CLINIC_OR_DEPARTMENT_OTHER): Payer: Self-pay

## 2023-03-10 ENCOUNTER — Telehealth: Payer: Self-pay

## 2023-03-10 NOTE — Telephone Encounter (Signed)
Started prior authorization for [facility] via covermymeds.

## 2023-03-10 NOTE — Telephone Encounter (Signed)
Sent patient MyChart message.

## 2023-03-10 NOTE — Telephone Encounter (Signed)
Patient called stating that she has been waiting on a Prior Authorization for [facility]. Pt states that her pharmacy has sent her a text stating that the Mancel Parsons is ready, but her costs is $831.07. Pt would like a call to discuss a PA and/or a possible replacement. Please call at pt at [phone].

## 2023-03-10 NOTE — Progress Notes (Unsigned)
Chief Complaint:   OBESITY Adrienne Burke (MR# [id number]) is a 62 y.o. female who presents for evaluation and treatment of obesity and related comorbidities. Current BMI is Body mass index is 31.12 kg/m. Adrienne Burke has been struggling with her weight for many years and has been unsuccessful in either losing weight, maintaining weight loss, or reaching her healthy weight goal.  Adrienne Burke is currently in the action stage of change and ready to dedicate time achieving and maintaining a healthier weight. Adrienne Burke is interested in becoming our patient and working on intensive lifestyle modifications including (but not limited to) diet and exercise for weight loss.  Adrienne Burke's habits were reviewed today and are as follows: Her family eats meals together, she thinks her family will eat healthier with her, her desired weight loss is 27 lbs, she started gaining weight in her 30's and since Covid more so, her heaviest weight ever was 198 pounds, she has significant food cravings issues, she snacks frequently in the evenings, she skips meals frequently, she is frequently drinking liquids with calories, she frequently makes poor food choices, she has problems with excessive hunger, she frequently eats larger portions than normal, and she struggles with emotional eating.  Depression Screen Adrienne Burke's Food and Mood (modified PHQ-9) score was 7.  Subjective:   1. Other fatigue Adrienne Burke denies daytime somnolence and denies waking up still tired. Patient has a history of symptoms of N/A. Adrienne Burke generally gets 7 hours of sleep per night, and states that she has generally restful sleep. Snoring is present. Apneic episodes are not present. Epworth Sleepiness Score is 6.   2. SOBOE (shortness of breath on exertion) Adrienne Burke notes increasing shortness of breath with exercising and seems to be worsening over time with weight gain. She notes getting out of breath sooner with activity than she used to. This has not gotten worse recently. Adrienne Burke  denies shortness of breath at rest or orthopnea.  3. Prediabetes Patient has mild constipation.  Patient is taking Wegovy 0.25 mg.  Patient is positive for fatigue but denies nausea or vomiting.  4. Other hyperlipidemia Patient is taking Lipitor.    5. Vitamin D deficiency Patient denies muscle weakness.   6. B12 deficiency Patient is taking a multivitamin intermittently.  Assessment/Plan:   1. Other fatigue Dezarai does feel that her weight is causing her energy to be lower than it should be. Fatigue may be related to obesity, depression or many other causes. Labs will be ordered, and in the meanwhile, Saryah will focus on self care including making healthy food choices, increasing physical activity and focusing on stress reduction.  - EKG 12-Lead - TSH+T4F+T3Free  2. SOBOE (shortness of breath on exertion) Adrienne Burke does feel that she gets out of breath more easily that she used to when she exercises. Adrienne Burke's shortness of breath appears to be obesity related and exercise induced. She has agreed to work on weight loss and gradually increase exercise to treat her exercise induced shortness of breath. Will continue to monitor closely.  - TSH+T4F+T3Free  3. Prediabetes 1.  Papaya enzymes at health food store. 2.  MiraLAX daily with Benefiber. 3.  Wegovy 0.5 mg into the skin 2 mL no refills. 4.  Check labs today.  - Insulin, random  4. Other hyperlipidemia Continue Lipitor. Will keep saturated fats low.  Check labs today.   - Comprehensive metabolic panel  5. Vitamin D deficiency Check labs today.   - VITAMIN D 25 Hydroxy (Vit-D Deficiency, Fractures)  6. B12  deficiency Check labs today.  - Vitamin B12  7. Depression screen Jazilyn had a positive depression screening. Depression is commonly associated with obesity and often results in emotional eating behaviors. We will monitor this closely and work on CBT to help improve the non-hunger eating patterns. Referral to Psychology may  be required if no improvement is seen as she continues in our clinic.  8. Generalized obesity Check labs today.   - VITAMIN D 25 Hydroxy (Vit-D Deficiency, Fractures) - Vitamin B12 - TSH+T4F+T3Free - Lipid Panel With LDL/HDL Ratio - Insulin, random - Comprehensive metabolic panel  9. BMI 31.0-31.9,adult Start- Semaglutide-Weight Management (WEGOVY) 0.5 MG/0.5ML SOAJ; Inject 0.5 mg into the skin once a week.  Dispense: 2 mL; Refill: 0  Adrienne Burke is currently in the action stage of change and her goal is to continue with weight loss efforts. I recommend Adrienne Burke begin the structured treatment plan as follows:  She has agreed to the Category 1 Plan.  Exercise goals: For substantial health benefits, adults should do at least 150 minutes (2 hours and 30 minutes) a week of moderate-intensity, or 75 minutes (1 hour and 15 minutes) a week of vigorous-intensity aerobic physical activity, or an equivalent combination of moderate- and vigorous-intensity aerobic activity. Aerobic activity should be performed in episodes of at least 10 minutes, and preferably, it should be spread throughout the week.   Behavioral modification strategies: increasing lean protein intake, decreasing simple carbohydrates, increasing vegetables, increasing water intake, decreasing eating out, no skipping meals, meal planning and cooking strategies, keeping healthy foods in the home, avoiding temptations, and planning for success.  She was informed of the importance of frequent follow-up visits to maximize her success with intensive lifestyle modifications for her multiple health conditions. She was informed we would discuss her lab results at her next visit unless there is a critical issue that needs to be addressed sooner. Adrienne Burke agreed to keep her next visit at the agreed upon time to discuss these results.  Objective:   Blood pressure 96/64, pulse 65, temperature 97.7 F (36.5 C), height 5\' 5"  (1.651 m), weight 187 lb (84.8 kg),  SpO2 100 %. Body mass index is 31.12 kg/m.  EKG: Normal sinus rhythm, rate 65 bpm.  Indirect Calorimeter completed today shows a VO2 of 192 and a REE of 1325.  Her calculated basal metabolic rate is 123456 thus her basal metabolic rate is worse than expected.  General: Cooperative, alert, well developed, in no acute distress. HEENT: Conjunctivae and lids unremarkable. Cardiovascular: Regular rhythm.  Lungs: Normal work of breathing. Neurologic: No focal deficits.   Lab Results  Component Value Date   CREATININE 0.87 03/05/2023   BUN 14 03/05/2023   NA 135 03/05/2023   K 4.6 03/05/2023   CL 98 03/05/2023   CO2 23 03/05/2023   Lab Results  Component Value Date   ALT 6 03/05/2023   AST 19 03/05/2023   ALKPHOS 75 03/05/2023   BILITOT 0.4 03/05/2023   Lab Results  Component Value Date   HGBA1C 6.2 01/16/2023   HGBA1C 6.3 07/09/2022   HGBA1C 6.1 01/07/2022   HGBA1C 6.0 07/25/2021   HGBA1C 5.9 (H) 10/10/2020   Lab Results  Component Value Date   INSULIN 7.1 03/05/2023   Lab Results  Component Value Date   TSH 1.960 03/05/2023   Lab Results  Component Value Date   CHOL 188 03/05/2023   HDL 62 03/05/2023   LDLCALC 110 (H) 03/05/2023   LDLDIRECT 164.0 07/16/2011   TRIG 91  03/05/2023   CHOLHDL 4 01/16/2023   Lab Results  Component Value Date   WBC 5.7 01/07/2022   HGB 12.8 01/07/2022   HCT 39.2 01/07/2022   MCV 92.4 01/07/2022   PLT 276.0 01/07/2022   Lab Results  Component Value Date   IRON 64 04/19/2010   FERRITIN 45.5 04/19/2010   Attestation Statements:   Reviewed by clinician on day of visit: allergies, medications, problem list, medical history, surgical history, family history, social history, and previous encounter notes.  Delfina Redwood, am acting as Location manager for [employer], DO.  I have reviewed the above documentation for accuracy and completeness, and I agree with the above. - ***

## 2023-03-11 ENCOUNTER — Other Ambulatory Visit (HOSPITAL_BASED_OUTPATIENT_CLINIC_OR_DEPARTMENT_OTHER): Payer: Self-pay

## 2023-03-11 NOTE — Telephone Encounter (Signed)
Prior authorization denied by [employer] due to not having good outcome from the drug.

## 2023-03-18 ENCOUNTER — Other Ambulatory Visit: Payer: Self-pay | Admitting: Family

## 2023-03-19 ENCOUNTER — Ambulatory Visit (INDEPENDENT_AMBULATORY_CARE_PROVIDER_SITE_OTHER): Payer: Commercial Managed Care - PPO | Admitting: Bariatrics

## 2023-03-19 ENCOUNTER — Encounter: Payer: Self-pay | Admitting: Bariatrics

## 2023-03-19 ENCOUNTER — Other Ambulatory Visit (HOSPITAL_BASED_OUTPATIENT_CLINIC_OR_DEPARTMENT_OTHER): Payer: Self-pay

## 2023-03-19 VITALS — BP 98/65 | HR 66 | Temp 97.9°F | Ht 65.0 in | Wt 184.0 lb

## 2023-03-19 DIAGNOSIS — E669 Obesity, unspecified: Secondary | ICD-10-CM

## 2023-03-19 DIAGNOSIS — E559 Vitamin D deficiency, unspecified: Secondary | ICD-10-CM | POA: Diagnosis not present

## 2023-03-19 DIAGNOSIS — R7303 Prediabetes: Secondary | ICD-10-CM

## 2023-03-19 DIAGNOSIS — Z6830 Body mass index (BMI) 30.0-30.9, adult: Secondary | ICD-10-CM | POA: Diagnosis not present

## 2023-03-19 MED ORDER — VITAMIN D (ERGOCALCIFEROL) 1.25 MG (50000 UNIT) PO CAPS
50000.0000 [IU] | ORAL_CAPSULE | ORAL | 0 refills | Status: DC
  Filled 2023-03-19: qty 5, 35d supply, fill #0

## 2023-03-19 MED ORDER — VITAMIN D (ERGOCALCIFEROL) 1.25 MG (50000 UNIT) PO CAPS
50000.0000 [IU] | ORAL_CAPSULE | ORAL | 0 refills | Status: DC
  Filled 2023-03-19: qty 4, 28d supply, fill #0

## 2023-03-19 MED ORDER — ZEPBOUND 5 MG/0.5ML ~~LOC~~ SOAJ
5.0000 mg | SUBCUTANEOUS | 0 refills | Status: DC
  Filled 2023-03-19: qty 2, 28d supply, fill #0

## 2023-03-20 ENCOUNTER — Encounter: Payer: Self-pay | Admitting: Internal Medicine

## 2023-03-20 MED ORDER — ATORVASTATIN CALCIUM 20 MG PO TABS: 20.0000 mg | ORAL_TABLET | Freq: Every day | ORAL | 1 refills | Status: DC

## 2023-03-24 ENCOUNTER — Encounter: Payer: Self-pay | Admitting: Bariatrics

## 2023-03-24 NOTE — Progress Notes (Signed)
Chief Complaint:   OBESITY Adrienne Burke is here to discuss her progress with her obesity treatment plan along with follow-up of her obesity related diagnoses. Adrienne Burke is on the Category 1 Plan and states she is following her eating plan approximately 80% of the time. Adrienne Burke states she is walking, lifting weights, and biking for 60 minutes 4-5 times per week.  Today's visit was #: 2 Starting weight: 187 lbs Starting date: 03/05/2023 Today's weight: 184 lbs Today's date: 03/19/2023 Total lbs lost to date: 3 Total lbs lost since last in-office visit: 3  Interim History: Adrienne Burke is down 3 lbs since her first visit. She states that she is following the meal plan well.   Subjective:   1. Vitamin D deficiency Adrienne Burke's recent Vitamin D level was 22.6.  2. Pre-diabetes Adrienne Burke's A1c was 6.2 on 01/16/2023. She is taking Wegovy with no nausea or other side effects.   Assessment/Plan:   1. Vitamin D deficiency Adrienne Burke agreed to start prescription Vitamin D 50,000 IU once weekly with no refills. Goal Vitamin D level is 50 or above.   - Vitamin D, Ergocalciferol, (DRISDOL) 1.25 MG (50000 UNIT) CAPS capsule; Take 1 capsule (50,000 Units total) by mouth every 7 (seven) days.  Dispense: 5 capsule; Refill: 0  2. Pre-diabetes Adrienne Burke agreed to start Zepbound 5 mg once weekly with no refills. Change from [facility] to Zepbound. Eating Out guide was given.   - tirzepatide (ZEPBOUND) 5 MG/0.5ML Pen; Inject 5 mg into the skin once a week.  Dispense: 2 mL; Refill: 0  3. Generalized obesity  4. BMI 30.0-30.9,adult Adrienne Burke is currently in the action stage of change. As such, her goal is to continue with weight loss efforts. She has agreed to the Category 1 Plan.   Meal planning was discussed. Reviewed labs with the patient today 03/05/2023, CMP, lipid, Vit D, B12, glucose, insulin, and thyroid panel. She will continue to track her calories and protein, and protein=100.  Exercise goals: As is.   Behavioral modification  strategies: increasing lean protein intake, decreasing simple carbohydrates, increasing vegetables, increasing water intake, decreasing eating out, no skipping meals, meal planning and cooking strategies, and keeping healthy foods in the home.  Adrienne Burke has agreed to follow-up with our clinic in 3 weeks. She was informed of the importance of frequent follow-up visits to maximize her success with intensive lifestyle modifications for her multiple health conditions.   Objective:   Blood pressure 98/65, pulse 66, temperature 97.9 F (36.6 C), height  (1.651 m), weight 184 lb (83.5 kg), SpO2 95 %. Body mass index is 30.62 kg/m.  General: Cooperative, alert, well developed, in no acute distress. HEENT: Conjunctivae and lids unremarkable. Cardiovascular: Regular rhythm.  Lungs: Normal work of breathing. Neurologic: No focal deficits.   Lab Results  Component Value Date   CREATININE 0.87 03/05/2023   BUN 14 03/05/2023   NA 135 03/05/2023   K 4.6 03/05/2023   CL 98 03/05/2023   CO2 23 03/05/2023   Lab Results  Component Value Date   ALT 6 03/05/2023   AST 19 03/05/2023   ALKPHOS 75 03/05/2023   BILITOT 0.4 03/05/2023   Lab Results  Component Value Date   HGBA1C 6.2 01/16/2023   HGBA1C 6.3 07/09/2022   HGBA1C 6.1 01/07/2022   HGBA1C 6.0 07/25/2021   HGBA1C 5.9 (H) 10/10/2020   Lab Results  Component Value Date   INSULIN 7.1 03/05/2023   Lab Results  Component Value Date   TSH 1.960 03/05/2023  Lab Results  Component Value Date   CHOL 188 03/05/2023   HDL 62 03/05/2023   LDLCALC 110 (H) 03/05/2023   LDLDIRECT 164.0 07/16/2011   TRIG 91 03/05/2023   CHOLHDL 4 01/16/2023   Lab Results  Component Value Date   VD25OH 22.6 (L) 03/05/2023   Lab Results  Component Value Date   WBC 5.7 01/07/2022   HGB 12.8 01/07/2022   HCT 39.2 01/07/2022   MCV 92.4 01/07/2022   PLT 276.0 01/07/2022   Lab Results  Component Value Date   IRON 64 04/19/2010   FERRITIN 45.5  04/19/2010   Attestation Statements:   Reviewed by clinician on day of visit: allergies, medications, problem list, medical history, surgical history, family history, social history, and previous encounter notes.   Trude Mcburney, am acting as Energy manager for [employer], DO.  I have reviewed the above documentation for accuracy and completeness, and I agree with the above. Corinna Capra, DO

## 2023-03-31 ENCOUNTER — Ambulatory Visit: Payer: Commercial Managed Care - PPO

## 2023-04-01 ENCOUNTER — Other Ambulatory Visit: Payer: Self-pay | Admitting: Obstetrics and Gynecology

## 2023-04-01 ENCOUNTER — Ambulatory Visit
Admission: RE | Admit: 2023-04-01 | Discharge: 2023-04-01 | Disposition: A | Discharge status: Home / Self Care | Payer: Commercial Managed Care - PPO | Source: Ambulatory Visit | Attending: Obstetrics and Gynecology | Admitting: Obstetrics and Gynecology

## 2023-04-01 DIAGNOSIS — Z0000 Encounter for general adult medical examination without abnormal findings: Secondary | ICD-10-CM

## 2023-04-13 ENCOUNTER — Other Ambulatory Visit (HOSPITAL_BASED_OUTPATIENT_CLINIC_OR_DEPARTMENT_OTHER): Payer: Self-pay

## 2023-04-13 ENCOUNTER — Ambulatory Visit (INDEPENDENT_AMBULATORY_CARE_PROVIDER_SITE_OTHER): Payer: Commercial Managed Care - PPO | Admitting: Bariatrics

## 2023-04-13 ENCOUNTER — Encounter: Payer: Self-pay | Admitting: Bariatrics

## 2023-04-13 VITALS — BP 108/68 | HR 68 | Temp 97.8°F | Ht 65.0 in | Wt 178.0 lb

## 2023-04-13 DIAGNOSIS — E559 Vitamin D deficiency, unspecified: Secondary | ICD-10-CM | POA: Diagnosis not present

## 2023-04-13 DIAGNOSIS — R632 Polyphagia: Secondary | ICD-10-CM | POA: Diagnosis not present

## 2023-04-13 DIAGNOSIS — E669 Obesity, unspecified: Secondary | ICD-10-CM

## 2023-04-13 DIAGNOSIS — R7303 Prediabetes: Secondary | ICD-10-CM | POA: Diagnosis not present

## 2023-04-13 DIAGNOSIS — Z6829 Body mass index (BMI) 29.0-29.9, adult: Secondary | ICD-10-CM

## 2023-04-13 MED ORDER — ZEPBOUND 5 MG/0.5ML ~~LOC~~ SOAJ
5.0000 mg | SUBCUTANEOUS | 0 refills | Status: DC
Start: 2023-04-13 — End: 2023-05-06
  Filled 2023-04-13: qty 2, 28d supply, fill #0

## 2023-04-13 MED ORDER — VITAMIN D (ERGOCALCIFEROL) 1.25 MG (50000 UNIT) PO CAPS
50000.0000 [IU] | ORAL_CAPSULE | ORAL | 0 refills | Status: DC
Start: 2023-04-13 — End: 2023-06-03
  Filled 2023-04-13: qty 4, 28d supply, fill #0

## 2023-04-13 NOTE — Progress Notes (Signed)
WEIGHT SUMMARY AND BIOMETRICS  Weight Lost Since Last Visit: 6lb  Vitals Temp: 97.8 F (36.6 C) BP: 108/68 Pulse Rate: 68 SpO2: 99 %   Anthropometric Measurements Height: 5\' 5"  (1.651 m) Weight: 178 lb (80.7 kg) BMI (Calculated): 29.62 Weight at Last Visit: 184lb Weight Lost Since Last Visit: 6lb Starting Weight: 187lb Total Weight Loss (lbs): 9 lb (4.082 kg)   Body Composition  Body Fat %: 35.6 % Fat Mass (lbs): 63.6 lbs Muscle Mass (lbs): 109.2 lbs Total Body Water (lbs): 74 lbs Visceral Fat Rating : 9   Other Clinical Data Fasting: no Labs: no Today's Visit #: 3 Starting Date: 03/05/23    OBESITY Adrienne Burke is here to discuss her progress with her obesity treatment plan along with follow-up of her obesity related diagnoses.     Nutrition Plan: the Category 1 plan - 90% adherence.  Current exercise: walking, weightlifting, and line dancing  Interim History:  She is down 6 lbs since her last visit.  Protein intake is as prescribed  Pharmacotherapy: Adrienne Burke is on Zepbound 5.0 mg SQ weekly Adverse side effects:  fatigue that has subsided.  Hunger is well controlled.  Cravings are moderately controlled.  Assessment/Plan:   1. Vitamin D deficiency  She is taking her vitamin D as directed.  Plan: Rx: Vitamin D 50,000 international units to be taken weekly #5 with 0 refills   2. Pre-diabetes She is eating fewer carbohydrates.  She is taking Zepbound for weight loss and polyphagia   Plan: She will continue to limit her carbohydrates both starches and sweets.   3. Polyphagia Adrienne Burke endorses excessive hunger.  Medication(s): Zepbound Effects of medication:  well controlled. Cravings are moderately controlled.   Plan: Medication(s): Zepbound 5.0 mg SQ weekly Will increase water, protein and fiber to help assuage hunger.  Will minimize foods that have a high glucose index/load to minimize reactive hypoglycemia.    Generalized Obesity: Current  BMI BMI (Calculated): 29.62   Pharmacotherapy Plan Continue and refill  Zepbound 5.0 mg SQ weekly.  Set bound is currently on the national shortage list.  I told her that I could refill either Zepbound 2.5 mg or the 7.5 mg, which ever one becomes available if she cannot find the 5 mg at the available pharmacies.   Adrienne Burke is currently in the action stage of change. As such, her goal is to continue with weight loss efforts.  She has agreed to the Category 1 plan.  Exercise goals: For additional and more extensive health benefits, adults should increase their aerobic physical activity to 300 minutes (5 hours) a week of moderate-intensity, or 150 minutes a week of vigorous-intensity aerobic physical activity, or an equivalent combination of moderate- and vigorous-intensity activity. Additional health benefits are gained by engaging in physical activity beyond this amount.   Behavioral modification strategies: meal planning  and planning for success.  Adrienne Burke has agreed to follow-up with our clinic in 3 weeks.   No orders of the defined types were placed in this encounter.   There are no discontinued medications.   No orders of the defined types were placed in this encounter.     Objective:   VITALS: Per patient if applicable, see vitals. GENERAL: Alert and in no acute distress. CARDIOPULMONARY: No increased WOB. Speaking in clear sentences.  PSYCH: Pleasant and cooperative. Speech normal rate and rhythm. Affect is appropriate. Insight and judgement are appropriate. Attention is focused, linear, and appropriate.  NEURO: Oriented as arrived to appointment on time with  no prompting.   Attestation Statements:    This was prepared with the assistance of Engineer, civil (consulting).  Occasional wrong-word or sound-a-like substitutions may have occurred due to the inherent limitations of voice recognition software.   Adrienne Capra, DO

## 2023-04-14 ENCOUNTER — Other Ambulatory Visit: Payer: Self-pay

## 2023-04-14 ENCOUNTER — Other Ambulatory Visit (HOSPITAL_BASED_OUTPATIENT_CLINIC_OR_DEPARTMENT_OTHER): Payer: Self-pay

## 2023-04-16 ENCOUNTER — Other Ambulatory Visit (HOSPITAL_BASED_OUTPATIENT_CLINIC_OR_DEPARTMENT_OTHER): Payer: Self-pay

## 2023-04-16 ENCOUNTER — Telehealth (INDEPENDENT_AMBULATORY_CARE_PROVIDER_SITE_OTHER): Payer: Self-pay | Admitting: Bariatrics

## 2023-04-16 NOTE — Telephone Encounter (Signed)
Patient stated she needs the 5mg  of Zepbound to be sent to her pharmacy.  She stated the pharmacy was out but now it is back in stock. Could you please call this in before closing today and give patient a call to confirm. Thank you!

## 2023-04-16 NOTE — Telephone Encounter (Signed)
Called pharmacy and spoke with Michele Mcalpine,  he informed me that the Zepbound is ready for pick up.  I called patient and left a detailed message that medication was ready for pick up with a co-pay fee of $103.66 via [employer].

## 2023-04-20 ENCOUNTER — Ambulatory Visit: Payer: Commercial Managed Care - PPO | Admitting: Bariatrics

## 2023-05-06 ENCOUNTER — Ambulatory Visit (INDEPENDENT_AMBULATORY_CARE_PROVIDER_SITE_OTHER): Payer: Commercial Managed Care - PPO | Admitting: Bariatrics

## 2023-05-06 ENCOUNTER — Other Ambulatory Visit (HOSPITAL_BASED_OUTPATIENT_CLINIC_OR_DEPARTMENT_OTHER): Payer: Self-pay

## 2023-05-06 ENCOUNTER — Other Ambulatory Visit: Payer: Self-pay

## 2023-05-06 ENCOUNTER — Encounter: Payer: Self-pay | Admitting: Bariatrics

## 2023-05-06 DIAGNOSIS — Z6828 Body mass index (BMI) 28.0-28.9, adult: Secondary | ICD-10-CM | POA: Diagnosis not present

## 2023-05-06 DIAGNOSIS — E559 Vitamin D deficiency, unspecified: Secondary | ICD-10-CM

## 2023-05-06 DIAGNOSIS — E669 Obesity, unspecified: Secondary | ICD-10-CM

## 2023-05-06 DIAGNOSIS — R7303 Prediabetes: Secondary | ICD-10-CM | POA: Diagnosis not present

## 2023-05-06 MED ORDER — ONDANSETRON HCL 4 MG PO TABS
4.0000 mg | ORAL_TABLET | Freq: Three times a day (TID) | ORAL | 0 refills | Status: DC | PRN
  Filled 2023-05-06: qty 20, 7d supply, fill #0

## 2023-05-06 MED ORDER — ZEPBOUND 7.5 MG/0.5ML ~~LOC~~ SOAJ
7.5000 mg | SUBCUTANEOUS | 0 refills | Status: DC
  Filled 2023-05-06: qty 2, 28d supply, fill #0

## 2023-05-06 NOTE — Progress Notes (Signed)
WEIGHT SUMMARY AND BIOMETRICS  Weight Lost Since Last Visit: 5lb   Vitals Temp: (!) 97.5 F (36.4 C) BP: 95/64 Pulse Rate: 68 SpO2: 97 %   Anthropometric Measurements Height: 5\' 5"  (1.651 m) Weight: 173 lb (78.5 kg) BMI (Calculated): 28.79 Weight at Last Visit: 178lb Weight Lost Since Last Visit: 5lb Starting Weight: 187lb Total Weight Loss (lbs): 14 lb (6.35 kg)   Body Composition  Body Fat %: 33.8 % Fat Mass (lbs): 58.6 lbs Muscle Mass (lbs): 108.8 lbs Total Body Water (lbs): 72.4 lbs Visceral Fat Rating : 8   Other Clinical Data Fasting: no Labs: no Today's Visit #: 4 Starting Date: 03/05/23    OBESITY Tamitha is here to discuss her progress with her obesity treatment plan along with follow-up of her obesity related diagnoses.     Nutrition Plan: the Category 1 plan - 80% adherence.  Current exercise: Walking, bicycling, line dancing and lifting weights.  Interim History:  She is down 5 lbs since her last visit.  Is not skipping meals, Meeting protein goals., and Water intake is adequate.  Pharmacotherapy: Lakinya is on Zepbound 5.0 mg SQ weekly Adverse side effects: None Hunger is moderately controlled.  Cravings are moderately controlled.  Assessment/Plan:   1. Vitamin D deficiency  Vitamin D is not at goal of 50.  Most recent vitamin D level was 22.,6. She is on  prescription ergocalciferol 50,000 IU weekly. Lab Results  Component Value Date   VD25OH 22.6 (L) 03/05/2023    Plan: Continue prescription vitamin D 50,000 IU weekly.   2. Pre-diabetes  Last A1c was 6.2  Medication(s): Zepbound Zepbound 5.0 mg SQ weekly Lab Results  Component Value Date   HGBA1C 6.2 01/16/2023   HGBA1C 6.3 07/09/2022   HGBA1C 6.1 01/07/2022   HGBA1C 6.0 07/25/2021   HGBA1C 5.9 (H) 10/10/2020   Lab Results  Component Value Date   INSULIN 7.1 03/05/2023    Plan: Will minimize all refined carbohydrates both sweets and starches.  Will work  on the plan and exercise.  Consider both aerobic and resistance training.  Will keep protein, water, and fiber intake high.  Increase Polyunsaturated and Monounsaturated fats to increase satiety and encourage weight loss.  Aim for 7 to 9 hours of sleep nightly.  Continue and refill Zepbound 7.5 mg SQ weekly Will increase to Zepbound 10 mg if no availability for the 7.5 mg dose, and Zofran 4 mg one by mouth every 8 hours as needed, # 20 X 0 refills.      Generalized Obesity: Current BMI BMI (Calculated): 28.79   Pharmacotherapy Plan Continue  Zepbound 7.5 mg SQ weekly  Ciani is not currently in the action stage of change. As such, her goal is to continue with weight loss efforts.  She has agreed to the Category 1 plan.  Exercise goals: For substantial health benefits, adults should do at least 150 minutes (2 hours and 30 minutes) a week of moderate-intensity, or 75 minutes (1 hour and 15 minutes) a week of vigorous-intensity aerobic physical activity, or an equivalent combination of moderate- and vigorous-intensity aerobic activity. Aerobic activity should be performed in episodes of at least 10 minutes, and preferably, it should be spread throughout the week.  Behavioral modification strategies: no meal skipping, increase water intake, and avoiding temptations.  Rissa has agreed to follow-up with our clinic in 4 weeks.        Objective:   VITALS: Per patient if applicable, see vitals. GENERAL: Alert and in  no acute distress. CARDIOPULMONARY: No increased WOB. Speaking in clear sentences.  PSYCH: Pleasant and cooperative. Speech normal rate and rhythm. Affect is appropriate. Insight and judgement are appropriate. Attention is focused, linear, and appropriate.  NEURO: Oriented as arrived to appointment on time with no prompting.   Attestation Statements:    This was prepared with the assistance of Engineer, civil (consulting).  Occasional wrong-word or sound-a-like substitutions may  have occurred due to the inherent limitations of voice recognition software.   Corinna Capra, DO

## 2023-05-08 ENCOUNTER — Ambulatory Visit (INDEPENDENT_AMBULATORY_CARE_PROVIDER_SITE_OTHER): Payer: Commercial Managed Care - PPO | Admitting: Internal Medicine

## 2023-05-08 ENCOUNTER — Other Ambulatory Visit: Payer: Self-pay | Admitting: Internal Medicine

## 2023-05-08 VITALS — BP 105/64 | HR 74 | Temp 97.5°F | Resp 16 | Ht 64.0 in | Wt 174.6 lb

## 2023-05-08 DIAGNOSIS — R739 Hyperglycemia, unspecified: Secondary | ICD-10-CM | POA: Diagnosis not present

## 2023-05-08 DIAGNOSIS — E785 Hyperlipidemia, unspecified: Secondary | ICD-10-CM | POA: Diagnosis not present

## 2023-05-08 LAB — HEMOGLOBIN A1C: Hgb A1c MFr Bld: 5.8 % (ref 4.6–6.5)

## 2023-05-08 NOTE — Patient Instructions (Addendum)
  GO TO THE LAB : Get the blood work     GO TO THE FRONT DESK, PLEASE SCHEDULE YOUR APPOINTMENTS Come back for   a physical 12-2023

## 2023-05-08 NOTE — Assessment & Plan Note (Signed)
Obesity: Since the last visit, is under the care of the wellness clinic, on tirzepatide, it is working for her, + weight loss.  Reports that she has changed her diet habits is exercising regularly. praised. Hyperglycemia: Last A1c 6.2.  Recheck today Hyperlipidemia: Last LDL was 145, I recommended to increase atorvastatin from 20 mg to 40 mg but the patient elected to stay on 20 mg and the LDL is now 110, much improved likely from improved diet. Anxiety depression: Feeling very well emotionally.  Happy about her weight loss. RTC 12-2023 CPX

## 2023-05-08 NOTE — Progress Notes (Signed)
Subjective:    Patient ID: Adrienne Burke, female    DOB: 08-17-1961, 62 y.o.   MRN: [id number]  DOS:  05/08/2023 Type of visit - description: f/u  Since the last office visit is doing doing well, + weight loss. Feels great physically and emotionally although have mild side effects from tirzepatide  Wt Readings from Last 3 Encounters:  05/08/23 174 lb 9.6 oz (79.2 kg)  05/06/23 173 lb (78.5 kg)  04/13/23 178 lb (80.7 kg)    Review of Systems See above   Past Medical History:  Diagnosis Date   Actinic keratitis    hx of   Anxiety    Anxiety and depression    Atrophic vaginitis    Back pain    Colon polyp    Hyperplastic polyps 10/2004, Normal Cscope 2010    Constipation    Depression    ENDOMETRIOSIS    Genital HSV    HYPERLIPIDEMIA    Insomnia    Ovarian cyst    bilateral, s/p drainage    Past Surgical History:  Procedure Laterality Date   ANKLE FRACTURE SURGERY     APPENDECTOMY     BREAST IMPLANT EXCHANGE     breast lift-implant 2009, repeated 08-2009     COLONOSCOPY  2010,2015   EYE SURGERY     PRK for vision correction 02-2012   POLYPECTOMY  2015   adenoma polyp   TONSILLECTOMY AND ADENOIDECTOMY  1966   TUBAL LIGATION     uterine ablation      Current Outpatient Medications  Medication Instructions   atorvastatin (LIPITOR) 20 mg, Oral, Daily   LORazepam (ATIVAN) 0.5 MG tablet TAKE 1 TABLET BY MOUTH AT BEDTIME AS NEEDED FOR ANXIETY   Multiple Vitamins-Minerals (MULTIVITAMIN,TX-MINERALS) tablet 1 tablet, Daily   ondansetron (ZOFRAN) 4 mg, Oral, Every 8 hours PRN   sertraline (ZOLOFT) 50 mg, Oral, Daily   Vitamin D (Ergocalciferol) (DRISDOL) 50,000 Units, Oral, Every 7 days   Zepbound 7.5 mg, Subcutaneous, Weekly       Objective:   Physical Exam BP 105/64 (BP Location: Left Arm, Patient Position: Sitting, Cuff Size: Normal)   Pulse 74   Temp (!) 97.5 F (36.4 C) (Oral)   Resp 16   Ht 5\' 4"  (1.626 m)   Wt 174 lb 9.6 oz (79.2 kg)   LMP  (LMP  Unknown)   SpO2 95%   BMI 29.97 kg/m  General:   Well developed, NAD, BMI noted. HEENT:  Normocephalic . Face symmetric, atraumatic Lungs:  CTA B Normal respiratory effort, no intercostal retractions, no accessory muscle use. Heart: RRR,  no murmur.  Lower extremities: no pretibial edema bilaterally  Skin: Not pale. Not jaundice Neurologic:  alert & oriented X3.  Speech normal, gait appropriate for age and unassisted Psych--  Cognition and judgment appear intact.  Cooperative with normal attention span and concentration.  Behavior appropriate. No anxious or depressed appearing.      Assessment     Assessment  Hyperglycemia A1c 6.0 01-2018 Hyperlipidemia Anxiety, depression  GYN: Postmenopausal H/oh endometriosis. s/p exploration , DUB, endometrial ablasion H/o actinic keratosis, sees derm q year as of 05/2018 Pulmonary nodules: Last CT 10-2020, stable.  No further scheduled CT  PLAN Obesity: Since the last visit, is under the care of the wellness clinic, on tirzepatide, it is working for her, + weight loss.  Reports that she has changed her diet habits is exercising regularly. praised. Hyperglycemia: Last A1c 6.2.  Recheck today Hyperlipidemia: Last  LDL was 145, I recommended to increase atorvastatin from 20 mg to 40 mg but the patient elected to stay on 20 mg and the LDL is now 110, much improved likely from improved diet. Anxiety depression: Feeling very well emotionally.  Happy about her weight loss. RTC 12-2023 CPX

## 2023-05-13 ENCOUNTER — Encounter: Payer: Self-pay | Admitting: Internal Medicine

## 2023-05-13 ENCOUNTER — Telehealth: Payer: Self-pay

## 2023-05-13 NOTE — Telephone Encounter (Signed)
Received fax confirmation

## 2023-05-13 NOTE — Telephone Encounter (Signed)
Physical form completed and faxed back to [facility] at [phone]. Form sent for scanning.

## 2023-05-25 ENCOUNTER — Telehealth: Payer: Self-pay | Admitting: Internal Medicine

## 2023-05-25 NOTE — Telephone Encounter (Signed)
Requesting: lorazepam 0.5mg   Contract:07/09/22 UDS: 07/09/22 Last Visit: 05/08/23 Next Visit: 12/28/23 Last Refill: 02/26/23 #30 and 2RF  Please Advise

## 2023-05-25 NOTE — Telephone Encounter (Signed)
PDMP okay, Rx sent 

## 2023-06-03 ENCOUNTER — Other Ambulatory Visit (HOSPITAL_BASED_OUTPATIENT_CLINIC_OR_DEPARTMENT_OTHER): Payer: Self-pay

## 2023-06-03 ENCOUNTER — Other Ambulatory Visit: Payer: Self-pay | Admitting: Bariatrics

## 2023-06-03 ENCOUNTER — Encounter: Payer: Self-pay | Admitting: Bariatrics

## 2023-06-03 ENCOUNTER — Other Ambulatory Visit: Payer: Self-pay

## 2023-06-03 ENCOUNTER — Ambulatory Visit (INDEPENDENT_AMBULATORY_CARE_PROVIDER_SITE_OTHER): Payer: Commercial Managed Care - PPO | Admitting: Bariatrics

## 2023-06-03 VITALS — BP 93/64 | HR 78 | Temp 97.9°F | Ht 65.0 in | Wt 167.0 lb

## 2023-06-03 DIAGNOSIS — E669 Obesity, unspecified: Secondary | ICD-10-CM | POA: Diagnosis not present

## 2023-06-03 DIAGNOSIS — E559 Vitamin D deficiency, unspecified: Secondary | ICD-10-CM | POA: Diagnosis not present

## 2023-06-03 DIAGNOSIS — F419 Anxiety disorder, unspecified: Secondary | ICD-10-CM | POA: Diagnosis not present

## 2023-06-03 DIAGNOSIS — Z6827 Body mass index (BMI) 27.0-27.9, adult: Secondary | ICD-10-CM

## 2023-06-03 DIAGNOSIS — F32A Depression, unspecified: Secondary | ICD-10-CM

## 2023-06-03 MED ORDER — ZEPBOUND 7.5 MG/0.5ML ~~LOC~~ SOAJ
7.5000 mg | SUBCUTANEOUS | 0 refills | Status: DC
  Filled 2023-06-03: qty 2, 28d supply, fill #0

## 2023-06-03 MED ORDER — SERTRALINE HCL 50 MG PO TABS
50.0000 mg | ORAL_TABLET | Freq: Every day | ORAL | 0 refills | Status: DC
  Filled 2023-06-03: qty 30, 30d supply, fill #0

## 2023-06-03 MED ORDER — VITAMIN D (ERGOCALCIFEROL) 1.25 MG (50000 UNIT) PO CAPS
50000.0000 [IU] | ORAL_CAPSULE | ORAL | 0 refills | Status: DC
Start: 2023-06-03 — End: 2023-08-18
  Filled 2023-06-03: qty 4, 28d supply, fill #0

## 2023-06-03 NOTE — Progress Notes (Signed)
WEIGHT SUMMARY AND BIOMETRICS  Weight Lost Since Last Visit: 6lb  Vitals Temp: 97.9 F (36.6 C) BP: 93/64 Pulse Rate: 78 SpO2: 98 %   Anthropometric Measurements Height: 5\' 5"  (1.651 m) Weight: 167 lb (75.8 kg) BMI (Calculated): 27.79 Weight at Last Visit: 173lb Weight Lost Since Last Visit: 6lb Starting Weight: 187lb Total Weight Loss (lbs): 20 lb (9.072 kg)   Body Composition  Body Fat %: 34.2 % Fat Mass (lbs): 57.2 lbs Muscle Mass (lbs): 104.2 lbs Total Body Water (lbs): 70.8 lbs Visceral Fat Rating : 8   Other Clinical Data Fasting: yes Labs: no Today's Visit #: 4 Starting Date: 03/05/23    OBESITY Adrienne Burke is here to discuss her progress with her obesity treatment plan along with follow-up of her obesity related diagnoses.     Nutrition Plan: the Category 1 plan - 100% adherence.  Current exercise: none  Interim History:  She is down an additional 6 lbs and is doing well overall.  Protein intake is as prescribed, Is skipping meals, Is not skipping meals, Meeting calorie goals., and Water intake is adequate.  Pharmacotherapy: Adrienne Burke is on Zepbound 7.5 mg SQ weekly Adverse side effects: None Hunger is moderately controlled.  Cravings are moderately controlled.  Assessment/Plan:   1. Vitamin D deficiency Vitamin D Deficiency Vitamin D is not at goal of 50.  Most recent vitamin D level was 22.6. She is on  prescription ergocalciferol 50,000 IU weekly. Lab Results  Component Value Date   VD25OH 22.6 (L) 03/05/2023    Plan: Refill prescription vitamin D 50,000 IU weekly.   Depression/Anxiety: Adrienne Burke has had issues with anxiety and depression that is controlled with medications. Currently this is well controlled. Overall mood is stable. Denies suicidal/homicidal ideation. Medication(s): Zoloft 50 mg daily  Plan: Will continue her medications Rx: Zoloft 50 mg 1 daily.  Discussed distractions to curb eating behaviors. Discussed activities  to do with one's hands in the evening  Be sure to get adequate rest as lack of rest can trigger appetite.  Have plan in place for stressful events.  Consider other rewards besides food.      Generalized Obesity: Current BMI BMI (Calculated): 27.79  Will add in some weights for resistance.  Add Power-aid or Gatorade Zero daily.   Pharmacotherapy Plan Continue  Zepbound 7.5 mg SQ weekly  Adrienne Burke is currently in the action stage of change. As such, her goal is to continue with weight loss efforts.  She has agreed to the Category 1 plan.  Exercise goals: All adults should avoid inactivity. Some physical activity is better than none, and adults who participate in any amount of physical activity gain some health benefits.  Behavioral modification strategies: increasing lean protein intake, decreasing simple carbohydrates , meal planning , increase water intake, and planning for success.  Adrienne Burke has agreed to follow-up with our clinic in 4 weeks.      Objective:   VITALS: Per patient if applicable, see vitals. GENERAL: Alert and in no acute distress. CARDIOPULMONARY: No increased WOB. Speaking in clear sentences.  PSYCH: Pleasant and cooperative. Speech normal rate and rhythm. Affect is appropriate. Insight and judgement are appropriate. Attention is focused, linear, and appropriate.  NEURO: Oriented as arrived to appointment on time with no prompting.   Attestation Statements:    This was prepared with the assistance of Engineer, civil (consulting).  Occasional wrong-word or sound-a-like substitutions may have occurred due to the inherent limitations of voice recognition software.   Corinna Capra,  DO

## 2023-07-07 ENCOUNTER — Other Ambulatory Visit (HOSPITAL_BASED_OUTPATIENT_CLINIC_OR_DEPARTMENT_OTHER): Payer: Self-pay

## 2023-07-07 ENCOUNTER — Ambulatory Visit: Payer: Commercial Managed Care - PPO | Admitting: Bariatrics

## 2023-07-07 ENCOUNTER — Encounter: Payer: Self-pay | Admitting: Bariatrics

## 2023-07-07 VITALS — BP 94/66 | HR 68 | Temp 97.5°F | Ht 65.0 in | Wt 161.0 lb

## 2023-07-07 DIAGNOSIS — Z6826 Body mass index (BMI) 26.0-26.9, adult: Secondary | ICD-10-CM

## 2023-07-07 DIAGNOSIS — R7303 Prediabetes: Secondary | ICD-10-CM | POA: Diagnosis not present

## 2023-07-07 DIAGNOSIS — E669 Obesity, unspecified: Secondary | ICD-10-CM

## 2023-07-07 DIAGNOSIS — R632 Polyphagia: Secondary | ICD-10-CM | POA: Diagnosis not present

## 2023-07-07 MED ORDER — ZEPBOUND 7.5 MG/0.5ML ~~LOC~~ SOAJ
7.5000 mg | SUBCUTANEOUS | 0 refills | Status: DC
  Filled 2023-07-07: qty 2, 28d supply, fill #0

## 2023-07-07 NOTE — Progress Notes (Signed)
WEIGHT SUMMARY AND BIOMETRICS  Weight Lost Since Last Visit: 6lb   Vitals Temp: (!) 97.5 F (36.4 C) BP: 94/66 Pulse Rate: 68 SpO2: 97 %   Anthropometric Measurements Height: 5\' 5"  (1.651 m) Weight: 161 lb (73 kg) BMI (Calculated): 26.79 Weight at Last Visit: 167lb Weight Lost Since Last Visit: 6lb Starting Weight: 187lb Total Weight Loss (lbs): 26 lb (11.8 kg)   Body Composition  Body Fat %: 33.3 % Fat Mass (lbs): 53.6 lbs Muscle Mass (lbs): 102.2 lbs Total Body Water (lbs): 69.8 lbs Visceral Fat Rating : 8   Other Clinical Data Fasting: no Labs: no Today's Visit #: 5 Starting Date: 03/05/23    OBESITY Alexanda is here to discuss her progress with her obesity treatment plan along with follow-up of her obesity related diagnoses.     Nutrition Plan: the Category 1 plan - 90% adherence.  Current exercise: cardiovascular workout on exercise equipment and weightlifting  Interim History:  She is down another 6 lbs and doing well overall.  Eating all of the food on the plan., Protein intake is as prescribed, Is not skipping meals, Not journaling consistently., and Denies polyphagia  Pharmacotherapy: Moriyah is on Mounjaro 7.5 mg SQ weekly Adverse side effects: None Hunger is well controlled.  Cravings are moderately controlled.  Assessment/Plan:   Jone Cannistra endorses excessive hunger.  Medication(s): Mounjaro Effects of medication:  well controlled. Cravings are moderately controlled.   Plan: Medication(s): Mounjaro 7.5 mg SQ weekly Will increase water, protein and fiber to help assuage hunger.  Will minimize foods that have a high glucose index/load to minimize reactive hypoglycemia.   Prediabetes Last A1c was 5.8  Medication(s):  Mounjaro 7.5 mg SQ weekly Lab Results  Component Value Date   HGBA1C 5.8 05/08/2023   HGBA1C 6.2 01/16/2023   HGBA1C 6.3 07/09/2022   HGBA1C 6.1 01/07/2022   HGBA1C 6.0 07/25/2021   Lab Results   Component Value Date   INSULIN 7.1 03/05/2023    Plan: Will minimize all refined carbohydrates both sweets and starches.  Will work on the plan and exercise.  Consider both aerobic and resistance training. She will do more exercises for her larger muscle groups.  Will increase her healthy fats ( sheet given ).  Will keep protein, water, and fiber intake high.  Increase Polyunsaturated and Monounsaturated fats to increase satiety and encourage weight loss.   Will continue medications.  Continue and refill Mounjaro 7.5 mg SQ weekly    Generalized Obesity: Current BMI BMI (Calculated): 26.79   Pharmacotherapy Plan Continue and refill  Mounjaro 7.5 mg SQ weekly  Liddie is currently in the action stage of change. As such, her goal is to continue with weight loss efforts.  She has agreed to the Category 1 plan.  Exercise goals: All adults should avoid inactivity. Some physical activity is better than none, and adults who participate in any amount of physical activity gain some health benefits.  Behavioral modification strategies: increasing lean protein intake, avoiding temptations, and mindful eating.  Nashawn has agreed to follow-up with our clinic in 4 weeks.      Objective:   VITALS: Per patient if applicable, see vitals. GENERAL: Alert and in no acute distress. CARDIOPULMONARY: No increased WOB. Speaking in clear sentences.  PSYCH: Pleasant and cooperative. Speech normal rate and rhythm. Affect is appropriate. Insight and judgement are appropriate. Attention is focused, linear, and appropriate.  NEURO: Oriented as arrived to appointment on time with no prompting.   Attestation Statements:  This was prepared with the assistance of Engineer, civil (consulting).  Occasional wrong-word or sound-a-like substitutions may have occurred due to the inherent limitations of voice recognition software.   Corinna Capra, DO

## 2023-08-11 ENCOUNTER — Ambulatory Visit: Payer: Commercial Managed Care - PPO | Admitting: Bariatrics

## 2023-08-17 ENCOUNTER — Ambulatory Visit: Payer: Commercial Managed Care - PPO | Admitting: Bariatrics

## 2023-08-18 ENCOUNTER — Ambulatory Visit (INDEPENDENT_AMBULATORY_CARE_PROVIDER_SITE_OTHER): Payer: Commercial Managed Care - PPO | Admitting: Family Medicine

## 2023-08-18 ENCOUNTER — Other Ambulatory Visit (HOSPITAL_BASED_OUTPATIENT_CLINIC_OR_DEPARTMENT_OTHER): Payer: Self-pay

## 2023-08-18 ENCOUNTER — Encounter: Payer: Self-pay | Admitting: Family Medicine

## 2023-08-18 VITALS — BP 113/73 | HR 58 | Temp 97.5°F | Ht 65.0 in | Wt 158.0 lb

## 2023-08-18 DIAGNOSIS — E559 Vitamin D deficiency, unspecified: Secondary | ICD-10-CM | POA: Diagnosis not present

## 2023-08-18 DIAGNOSIS — E669 Obesity, unspecified: Secondary | ICD-10-CM

## 2023-08-18 DIAGNOSIS — Z6826 Body mass index (BMI) 26.0-26.9, adult: Secondary | ICD-10-CM | POA: Diagnosis not present

## 2023-08-18 DIAGNOSIS — R632 Polyphagia: Secondary | ICD-10-CM | POA: Diagnosis not present

## 2023-08-18 MED ORDER — VITAMIN D (ERGOCALCIFEROL) 1.25 MG (50000 UNIT) PO CAPS
50000.0000 [IU] | ORAL_CAPSULE | ORAL | 0 refills | Status: DC
Start: 2023-08-18 — End: 2023-09-15
  Filled 2023-08-18: qty 4, 28d supply, fill #0

## 2023-08-18 MED ORDER — ZEPBOUND 7.5 MG/0.5ML ~~LOC~~ SOAJ
7.5000 mg | SUBCUTANEOUS | 0 refills | Status: DC
Start: 2023-08-18 — End: 2023-09-15
  Filled 2023-08-18: qty 2, 28d supply, fill #0

## 2023-08-18 NOTE — Assessment & Plan Note (Signed)
Last vitamin D Lab Results  Component Value Date   VD25OH 22.6 (L) 03/05/2023   She remains on vitamin D 50,000 IU once weekly.  Her energy level is starting to improve.  She denies of her side effects.  Continue vitamin D 50,000 IU once weekly and plan to recheck level next visit.

## 2023-08-18 NOTE — Progress Notes (Signed)
Office: [phone]  /  Fax: [phone]  WEIGHT SUMMARY AND BIOMETRICS  Starting Date: 03/05/23  Starting Weight: 187lb   Weight Lost Since Last Visit: 3lb   No data recorded Body Composition  Body Fat %: 32.7 % Fat Mass (lbs): 51.8 lbs Muscle Mass (lbs): 101.2 lbs Total Body Water (lbs): 70.4 lbs Visceral Fat Rating : 8    HPI  Chief Complaint: OBESITY  Adrienne Burke is here to discuss her progress with her obesity treatment plan. She is on the the Category 1 Plan and states she is following her eating plan approximately 80 % of the time. She states she is exercising 0 minutes 0 times per week.   Interval History:  Since last office visit she is down 3 lb She had a vacation to [place] between visits She has done well on Zepbound 7.5 mg weekly injection with adequate satiety She has not been exercising as much lately but plans to resume walking, weights and line dancing She has been working more hours  She lost 1 pound of muscle mass and lost 1.8 pounds of body fat in the past month She has a net weight loss of 29 pounds in the past 5 months of medically supervised weight management This is a 15.5% total body weight loss  Pharmacotherapy: Zepbound 7.5 mg once weekly injection  PHYSICAL EXAM:  Height 5\' 5"  (1.651 m), weight 158 lb (71.7 kg). Body mass index is 26.29 kg/m.  General: She is healthy appearing, cooperative, alert, well developed, and in no acute distress. PSYCH: Has normal mood, affect and thought process.   Lungs: Normal breathing effort, no conversational dyspnea.   ASSESSMENT AND PLAN  TREATMENT PLAN FOR OBESITY:  Recommended Dietary Goals  [name] is currently in the action stage of change. As such, her goal is to continue weight management plan. She has agreed to the Category 1 Plan.  Behavioral Intervention  We discussed the following Behavioral Modification Strategies today: increasing lean protein intake, decreasing simple carbohydrates  , increasing vegetables, increasing lower glycemic fruits, increasing fiber rich foods, avoiding skipping meals, increasing water intake, work on meal planning and preparation, continue to practice mindfulness when eating, and planning for success.  Additional resources provided today: NA  Recommended Physical Activity Goals  [name] has been advised to work up to 150 minutes of moderate intensity aerobic activity a week and strengthening exercises 2-3 times per week for cardiovascular health, weight loss maintenance and preservation of muscle mass.   She has agreed to [employer] strengthening exercises with a goal of 2-3 sessions a week  and Start aerobic activity with a goal of 150 minutes a week at moderate intensity.   Pharmacotherapy changes for the treatment of obesity: None  ASSOCIATED CONDITIONS ADDRESSED TODAY  Polyphagia Assessment & Plan: Hunger and cravings have greatly improved with the addition of Zepbound.  She feels improved satiety on 7.5 mg once weekly injection without meal skipping, nausea, vomiting, constipation or acid reflux.  She is close to her target weight of 155 pounds and feels pretty good.  She is maintaining most of her lean muscle mass.  Continue Zepbound at 7.5 mg once weekly injection with plans to reduce to 5 mg a month thereafter.  She would like to be off of Zepbound by January 1.  Orders: -     Zepbound; Inject 7.5 mg into the skin once a week.  Dispense: 2 mL; Refill: 0  Vitamin D deficiency Assessment & Plan: Last vitamin D Lab Results  Component  Value Date   VD25OH 22.6 (L) 03/05/2023   She remains on vitamin D 50,000 IU once weekly.  Her energy level is starting to improve.  She denies of her side effects.  Continue vitamin D 50,000 IU once weekly and plan to recheck level next visit.  Orders: -     Vitamin D (Ergocalciferol); Take 1 capsule (50,000 Units total) by mouth every 7 (seven) days.  Dispense: 5 capsule; Refill: 0  Generalized obesity  with starting BMI 31  BMI 26.0-26.9,adult      She was informed of the importance of frequent follow up visits to maximize her success with intensive lifestyle modifications for her multiple health conditions.   ATTESTASTION STATEMENTS:  Reviewed by clinician on day of visit: allergies, medications, problem list, medical history, surgical history, family history, social history, and previous encounter notes pertinent to obesity diagnosis.   I have personally spent 30 minutes total time today in preparation, patient care, nutritional counseling and documentation for this visit, including the following: review of clinical lab tests; review of medical tests/procedures/services.      Glennis Brink, DO DABFM, DABOM [facility] [address] [place], [place] [address] [phone]

## 2023-08-18 NOTE — Assessment & Plan Note (Signed)
Hunger and cravings have greatly improved with the addition of Zepbound.  She feels improved satiety on 7.5 mg once weekly injection without meal skipping, nausea, vomiting, constipation or acid reflux.  She is close to her target weight of 155 pounds and feels pretty good.  She is maintaining most of her lean muscle mass.  Continue Zepbound at 7.5 mg once weekly injection with plans to reduce to 5 mg a month thereafter.  She would like to be off of Zepbound by January 1.

## 2023-08-19 ENCOUNTER — Telehealth: Payer: Self-pay | Admitting: Internal Medicine

## 2023-08-19 NOTE — Telephone Encounter (Signed)
PDMP reviewed, it is a slightly early but will send a refill.

## 2023-08-19 NOTE — Telephone Encounter (Signed)
Requesting: lorazepam 0.5mg   Contract:07/09/22 UDS:07/09/22 Last Visit: 05/08/23 Next Visit: 12/28/23 Last Refill: 05/25/23 #30 and 2RF   Please Advise

## 2023-09-15 ENCOUNTER — Ambulatory Visit (INDEPENDENT_AMBULATORY_CARE_PROVIDER_SITE_OTHER): Payer: Commercial Managed Care - PPO | Admitting: Bariatrics

## 2023-09-15 ENCOUNTER — Encounter: Payer: Self-pay | Admitting: Bariatrics

## 2023-09-15 ENCOUNTER — Other Ambulatory Visit (HOSPITAL_BASED_OUTPATIENT_CLINIC_OR_DEPARTMENT_OTHER): Payer: Self-pay

## 2023-09-15 VITALS — BP 102/70 | HR 63 | Temp 97.6°F | Ht 65.0 in | Wt 154.0 lb

## 2023-09-15 DIAGNOSIS — R7303 Prediabetes: Secondary | ICD-10-CM

## 2023-09-15 DIAGNOSIS — E559 Vitamin D deficiency, unspecified: Secondary | ICD-10-CM

## 2023-09-15 DIAGNOSIS — R632 Polyphagia: Secondary | ICD-10-CM

## 2023-09-15 DIAGNOSIS — Z6825 Body mass index (BMI) 25.0-25.9, adult: Secondary | ICD-10-CM

## 2023-09-15 DIAGNOSIS — E669 Obesity, unspecified: Secondary | ICD-10-CM

## 2023-09-15 MED ORDER — VITAMIN D (ERGOCALCIFEROL) 1.25 MG (50000 UNIT) PO CAPS
50000.0000 [IU] | ORAL_CAPSULE | ORAL | 0 refills | Status: DC
  Filled 2023-09-15: qty 4, 28d supply, fill #0

## 2023-09-15 MED ORDER — TIRZEPATIDE-WEIGHT MANAGEMENT 5 MG/0.5ML ~~LOC~~ SOAJ
5.0000 mg | SUBCUTANEOUS | 0 refills | Status: DC
  Filled 2023-09-15: qty 2, 28d supply, fill #0

## 2023-09-15 NOTE — Progress Notes (Signed)
WEIGHT SUMMARY AND BIOMETRICS  Weight Lost Since Last Visit: 4lb  Weight Gained Since Last Visit: 0   Vitals Temp: 97.6 F (36.4 C) BP: 102/70 Pulse Rate: 63 SpO2: 99 %   Anthropometric Measurements Height: 5\' 5"  (1.651 m) Weight: 154 lb (69.9 kg) BMI (Calculated): 25.63 Weight at Last Visit: 158lb Weight Lost Since Last Visit: 4lb Weight Gained Since Last Visit: 0 Starting Weight: 187lb   Body Composition  Body Fat %: 31.3 % Fat Mass (lbs): 48.2 lbs Muscle Mass (lbs): 100.4 lbs Total Body Water (lbs): 68.8 lbs Visceral Fat Rating : 7   Other Clinical Data Fasting: no Labs: no Today's Visit #: 7 Starting Date: 03/05/23    OBESITY Stacee is here to discuss her progress with her obesity treatment plan along with follow-up of her obesity related diagnoses.     Nutrition Plan: the Category 1 plan - 50% adherence.  Current exercise: bicycling, walking, weightlifting, and dancing  Interim History:  She is down another 4 lbs, and states that she is getting close to her goal weight.  Eating all of the food on the plan., Protein intake is as prescribed, Is not skipping meals, and Water intake is adequate.  Pharmacotherapy: Tykerria is on Mounjaro 7.5 mg SQ weekly Adverse side effects: None Hunger is moderately controlled.  Cravings are moderately controlled.  Assessment/Plan:   1. Vitamin D deficiency Vitamin D Deficiency Vitamin D is not at goal of 50.  Most recent vitamin D level was 22.6. She is on  prescription ergocalciferol 50,000 IU weekly. Lab Results  Component Value Date   VD25OH 22.6 (L) 03/05/2023    Plan: Refill prescription vitamin D 50,000 IU weekly.   Polyphagia Jaiah endorses excessive hunger.  Medication(s): Zepbound 7.5 mg Effects of medication:  moderately controlled. Cravings are moderately controlled.   Plan: Medication(s): Mounjaro 5.0 mg SQ weekly She is close to ideal weight and wants to start to taper dose of  Mounjaro.  Will increase water, protein and fiber to help assuage hunger.  Will minimize foods that have a high glucose index/load to minimize reactive hypoglycemia.   Prediabetes Last A1c was 5.8  Medication(s):  Zepbound 5.0 mg SQ weekly Lab Results  Component Value Date   HGBA1C 5.8 05/08/2023   HGBA1C 6.2 01/16/2023   HGBA1C 6.3 07/09/2022   HGBA1C 6.1 01/07/2022   HGBA1C 6.0 07/25/2021   Lab Results  Component Value Date   INSULIN 7.1 03/05/2023    Plan: Will minimize all refined carbohydrates both sweets and starches.  Will work on the plan and exercise.  Consider both aerobic and resistance training.  Will keep protein, water, and fiber intake high.  Increase Polyunsaturated and Monounsaturated fats to increase satiety and encourage weight loss.  Aim for 7 to 9 hours of sleep nightly.  Will continue medications.   Continue and decrease dose Mounjaro 5.0 mg SQ weekly     Generalized Obesity: Current BMI BMI (Calculated): 25.63   Pharmacotherapy Plan Continue and refill  Zepbound 5.0 mg SQ weekly  Roselynne is currently in the action stage of change. As such, her goal is to continue with weight loss efforts.  She has agreed to the Category 1 plan.  Exercise goals: All adults should avoid inactivity. Some physical activity is better than none, and adults who participate in any amount of physical activity gain some health benefits.  Behavioral modification strategies: increasing lean protein intake, decreasing simple carbohydrates , no meal skipping, decrease eating out, meal planning ,  increase water intake, better snacking choices, planning for success, and keep healthy foods in the home.  Jaala has agreed to follow-up with our clinic in 6 weeks.        Objective:   VITALS: Per patient if applicable, see vitals. GENERAL: Alert and in no acute distress. CARDIOPULMONARY: No increased WOB. Speaking in clear sentences.  PSYCH: Pleasant and cooperative. Speech normal  rate and rhythm. Affect is appropriate. Insight and judgement are appropriate. Attention is focused, linear, and appropriate.  NEURO: Oriented as arrived to appointment on time with no prompting.   Attestation Statements:   This was prepared with the assistance of Engineer, civil (consulting).  Occasional wrong-word or sound-a-like substitutions may have occurred due to the inherent limitations of voice recognition software. Corinna Capra, DO

## 2023-09-17 ENCOUNTER — Other Ambulatory Visit: Payer: Self-pay | Admitting: Internal Medicine

## 2023-10-01 ENCOUNTER — Other Ambulatory Visit: Payer: Self-pay | Admitting: Internal Medicine

## 2023-10-27 ENCOUNTER — Other Ambulatory Visit (HOSPITAL_BASED_OUTPATIENT_CLINIC_OR_DEPARTMENT_OTHER): Payer: Self-pay

## 2023-10-27 ENCOUNTER — Ambulatory Visit (INDEPENDENT_AMBULATORY_CARE_PROVIDER_SITE_OTHER): Payer: Commercial Managed Care - PPO | Admitting: Bariatrics

## 2023-10-27 ENCOUNTER — Encounter: Payer: Self-pay | Admitting: Bariatrics

## 2023-10-27 VITALS — BP 107/72 | HR 70 | Temp 97.9°F | Ht 65.0 in | Wt 150.0 lb

## 2023-10-27 DIAGNOSIS — Z6824 Body mass index (BMI) 24.0-24.9, adult: Secondary | ICD-10-CM | POA: Diagnosis not present

## 2023-10-27 DIAGNOSIS — R632 Polyphagia: Secondary | ICD-10-CM

## 2023-10-27 DIAGNOSIS — E669 Obesity, unspecified: Secondary | ICD-10-CM | POA: Diagnosis not present

## 2023-10-27 DIAGNOSIS — E66811 Obesity, class 1: Secondary | ICD-10-CM

## 2023-10-27 DIAGNOSIS — E785 Hyperlipidemia, unspecified: Secondary | ICD-10-CM | POA: Diagnosis not present

## 2023-10-27 MED ORDER — VITAMIN D (ERGOCALCIFEROL) 1.25 MG (50000 UNIT) PO CAPS
50000.0000 [IU] | ORAL_CAPSULE | ORAL | 0 refills | Status: DC
  Filled 2023-10-27: qty 4, 28d supply, fill #0

## 2023-10-27 MED ORDER — TIRZEPATIDE-WEIGHT MANAGEMENT 2.5 MG/0.5ML ~~LOC~~ SOAJ
2.5000 mg | SUBCUTANEOUS | 0 refills | Status: DC
  Filled 2023-10-27: qty 2, 28d supply, fill #0

## 2023-10-27 NOTE — Progress Notes (Signed)
WEIGHT SUMMARY AND BIOMETRICS  Weight Lost Since Last Visit: 4lb  Weight Gained Since Last Visit: 0   Vitals Temp: 97.9 F (36.6 C) BP: 107/72 Pulse Rate: 70 SpO2: 98 %   Anthropometric Measurements Height: 5\' 5"  (1.651 m) Weight: 150 lb (68 kg) BMI (Calculated): 24.96 Weight at Last Visit: 154lb Weight Lost Since Last Visit: 4lb Weight Gained Since Last Visit: 0 Starting Weight: 187lb Total Weight Loss (lbs): 37 lb (16.8 kg)   Body Composition  Body Fat %: 31.2 % Fat Mass (lbs): 47 lbs Muscle Mass (lbs): 98.6 lbs Total Body Water (lbs): 68.2 lbs Visceral Fat Rating : 7   Other Clinical Data Fasting: no Labs: no Today's Visit #: 8 Starting Date: 03/05/23    OBESITY Adrienne Burke is here to discuss her progress with her obesity treatment plan along with follow-up of her obesity related diagnoses.    Nutrition Plan: the Category 1 plan - 80% adherence.  Current exercise: none  Interim History:  She is down another 4 lbs since her last visit.  Eating all of the food on the plan., Protein intake is as prescribed, Is not skipping meals, and Water intake is adequate.   Pharmacotherapy: Adrienne Burke is on Zepbound 5.0 mg SQ weekly Adverse side effects: None Hunger is well controlled.  Cravings are well controlled.  Assessment/Plan:   Adrienne Burke endorses excessive hunger.  Medication(s): Zepbound 5 mg Effects of medication:  well controlled. Cravings are well controlled.   Plan: Medication(s): Zepbound 2.5 mg SQ weekly ( patient wants to taper the dose as she is at her ideal weight).  Will increase water, protein and fiber to help assuage hunger.  Will minimize foods that have a high glucose index/load to minimize reactive hypoglycemia.   Hyperlipidemia LDL is not at goal. Medication(s): Lipitor Cardiovascular risk factors: dyslipidemia, obesity  (BMI >= 30 kg/m2), and sedentary lifestyle  Lab Results  Component Value Date   CHOL 188 03/05/2023   HDL 62 03/05/2023   LDLCALC 110 (H) 03/05/2023   LDLDIRECT 164.0 07/16/2011   TRIG 91 03/05/2023   CHOLHDL 4 01/16/2023   Lab Results  Component Value Date   ALT 6 03/05/2023   AST 19 03/05/2023   ALKPHOS 75 03/05/2023   BILITOT 0.4 03/05/2023   The 10-year ASCVD risk score (Arnett DK, et al., 2019) is: 2.1%   Values used to calculate the score:     Age: 62 years     Sex: Female     Is Non-Hispanic African American: No     Diabetic: No     Tobacco smoker: No     Systolic Blood Pressure: 107 mmHg     Is BP treated: No     HDL Cholesterol: 62 mg/dL     Total Cholesterol: 188 mg/dL  Plan:  Continue statin.  Information sheet on healthy vs unhealthy fats.  Will avoid all trans fats.  Will read labels Will minimize saturated fats except the following: low fat meats in moderation, diary, and limited dark chocolate.    Generalized Obesity: Current BMI BMI (Calculated): 24.96 Will weigh herself weekly.  Will eat between 90 and 100 grams of protein daily.  She will continue to journal.  Pharmacotherapy Plan Continue and decrease dose  Zepbound 2.5 mg SQ weekly  Adrienne Burke is currently in the action stage of change. As such, her goal is to continue with weight loss efforts.  She has agreed to the Category 1 plan.  Exercise goals: For substantial health benefits, adults should do at least 150 minutes (2 hours and 30 minutes) a week of moderate-intensity, or 75 minutes (1 hour and 15 minutes) a week of vigorous-intensity aerobic physical activity, or an equivalent combination of moderate- and vigorous-intensity aerobic activity. Aerobic activity should be performed in episodes of at least 10 minutes, and preferably, it should be spread throughout the week.  Behavioral modification strategies: increasing lean protein intake, decreasing simple carbohydrates , no meal skipping, meal  planning , increase water intake, better snacking choices, planning for success, increasing vegetables, increasing lower sugar fruits, increasing fiber rich foods, get rid of junk food in the home, avoiding temptations, keep healthy foods in the home, holiday eating strategies , and mindful eating.  Adrienne Burke has agreed to follow-up with our clinic in 4 weeks.       Objective:   VITALS: Per patient if applicable, see vitals. GENERAL: Alert and in no acute distress. CARDIOPULMONARY: No increased WOB. Speaking in clear sentences.  PSYCH: Pleasant and cooperative. Speech normal rate and rhythm. Affect is appropriate. Insight and judgement are appropriate. Attention is focused, linear, and appropriate.  NEURO: Oriented as arrived to appointment on time with no prompting.   Attestation Statements:    This was prepared with the assistance of Engineer, civil (consulting).  Occasional wrong-word or sound-a-like substitutions may have occurred due to the inherent limitations of voice recognition    Corinna Capra, DO

## 2023-11-03 LAB — HM DEXA SCAN

## 2023-11-19 ENCOUNTER — Telehealth: Payer: Self-pay

## 2023-11-19 ENCOUNTER — Other Ambulatory Visit: Payer: Self-pay | Admitting: Bariatrics

## 2023-11-19 ENCOUNTER — Other Ambulatory Visit (HOSPITAL_BASED_OUTPATIENT_CLINIC_OR_DEPARTMENT_OTHER): Payer: Self-pay

## 2023-11-19 NOTE — Telephone Encounter (Signed)
Zepbound approved 11/19/23-11/18/24.

## 2023-11-19 NOTE — Telephone Encounter (Signed)
Started PA for Zepbound via covermymeds.

## 2023-11-25 ENCOUNTER — Encounter: Payer: Self-pay | Admitting: Bariatrics

## 2023-11-25 ENCOUNTER — Other Ambulatory Visit (HOSPITAL_BASED_OUTPATIENT_CLINIC_OR_DEPARTMENT_OTHER): Payer: Self-pay

## 2023-11-25 ENCOUNTER — Ambulatory Visit: Payer: Commercial Managed Care - PPO | Admitting: Bariatrics

## 2023-11-25 VITALS — BP 90/61 | HR 64 | Temp 97.7°F | Ht 65.0 in | Wt 151.0 lb

## 2023-11-25 DIAGNOSIS — M8580 Other specified disorders of bone density and structure, unspecified site: Secondary | ICD-10-CM

## 2023-11-25 DIAGNOSIS — R7303 Prediabetes: Secondary | ICD-10-CM | POA: Diagnosis not present

## 2023-11-25 DIAGNOSIS — Z780 Asymptomatic menopausal state: Secondary | ICD-10-CM

## 2023-11-25 DIAGNOSIS — R632 Polyphagia: Secondary | ICD-10-CM | POA: Diagnosis not present

## 2023-11-25 DIAGNOSIS — E559 Vitamin D deficiency, unspecified: Secondary | ICD-10-CM

## 2023-11-25 DIAGNOSIS — Z6825 Body mass index (BMI) 25.0-25.9, adult: Secondary | ICD-10-CM

## 2023-11-25 DIAGNOSIS — E669 Obesity, unspecified: Secondary | ICD-10-CM

## 2023-11-25 MED ORDER — VITAMIN D3 1.25 MG (50000 UT) PO CAPS
50000.0000 [IU] | ORAL_CAPSULE | ORAL | 0 refills | Status: DC
  Filled 2023-11-25: qty 4, 28d supply, fill #0

## 2023-11-25 MED ORDER — TIRZEPATIDE-WEIGHT MANAGEMENT 2.5 MG/0.5ML ~~LOC~~ SOAJ
2.5000 mg | SUBCUTANEOUS | 0 refills | Status: DC
  Filled 2023-11-25: qty 2, 28d supply, fill #0

## 2023-11-25 NOTE — Progress Notes (Signed)
WEIGHT SUMMARY AND BIOMETRICS  Weight Lost Since Last Visit: 0  Weight Gained Since Last Visit: 1lb   Vitals Temp: 97.7 F (36.5 C) BP: 90/61 Pulse Rate: 64 SpO2: 98 %   Anthropometric Measurements Height: 5\' 5"  (1.651 m) Weight: 151 lb (68.5 kg) BMI (Calculated): 25.13 Weight at Last Visit: 150lb Weight Lost Since Last Visit: 0 Weight Gained Since Last Visit: 1lb Starting Weight: 187lb Total Weight Loss (lbs): 36 lb (16.3 kg)   Body Composition  Body Fat %: 31.6 % Fat Mass (lbs): 47.8 lbs Muscle Mass (lbs): 98.2 lbs Total Body Water (lbs): 69 lbs Visceral Fat Rating : 7   Other Clinical Data Fasting: yes Labs: yes Today's Visit #: 9 Starting Date: 03/05/23    OBESITY Adrienne Burke is here to discuss her progress with her obesity treatment plan along with follow-up of her obesity related diagnoses.    Nutrition Plan: the Category 1 plan - 50-60% adherence.  Current exercise: walking and gym.   Interim History:  She is up 1 lb since her last visit. Eating all of the food on the plan., Protein intake is as prescribed, Is not skipping meals, Water intake is adequate., and Denies polyphagia   Pharmacotherapy: Adrienne Burke is on Zepbound 2.5 mg SQ weekly Adverse side effects: None Hunger is well controlled.  Cravings are moderately controlled.  Assessment/Plan:   Vitamin D Deficiency Vitamin D is not at goal of 50.  Most recent vitamin D level was 22.6. She is on  prescription ergocalciferol 50,000 IU weekly. Lab Results  Component Value Date   VD25OH 22.6 (L) 03/05/2023    Plan: Begin prescription vitamin D 3 50,000 IU weekly ( change from D 2 vitamin D)   Polyphagia Adrienne Burke endorses excessive hunger.  Medication(s): Zepbound Effects of medication:  moderately controlled. Cravings are moderately controlled.   Plan: Medication(s): Zepbound 2.5 mg SQ  weekly Will increase water, protein and fiber to help assuage hunger.  Will minimize foods that have a high glucose index/load to minimize reactive hypoglycemia.   Labs done today (CMP, Lipids, HgbA1c, insulin, vitamin D).  Prediabetes Last A1c was 5.5  Medication(s): Zepbound 2.5 mg SQ weekly Lab Results  Component Value Date   HGBA1C 5.8 05/08/2023   HGBA1C 6.2 01/16/2023   HGBA1C 6.3 07/09/2022   HGBA1C 6.1 01/07/2022   HGBA1C 6.0 07/25/2021   Lab Results  Component Value Date   INSULIN 7.1 03/05/2023    Plan: Will minimize all refined carbohydrates both sweets and starches.  Will work on the plan and exercise.  Consider both aerobic and resistance training.  Will keep protein, water, and fiber intake high.  Increase Polyunsaturated and Monounsaturated fats to increase satiety and encourage weight loss.  Aim for 7 to 9 hours of sleep nightly.  Will continue medications.  Continue and refill Zepbound 2.5 mg SQ weekly   Osteopenia after  menopause:   She states that her bone scan showed osteopenia.   Plan: She will begin calciumat 1,200 mg daily and vitamin D3 ( change from D2 ). Weight bearing exercise and a weighted vest for walking.      Generalized Obesity: Current BMI BMI (Calculated): 25.13   Pharmacotherapy Plan Continue and refill  Zepbound 2.5 mg SQ weekly  Adrienne Burke is currently in the action stage of change. As such, her goal is to maintain her weight for now.  She has agreed to the Category 1 plan.  Exercise goals: All adults should avoid inactivity. Some physical activity is better than none, and adults who participate in any amount of physical activity gain some health benefits.  Behavioral modification strategies: increasing lean protein intake, decreasing simple carbohydrates , no meal skipping, meal planning , increase water intake, better snacking choices, planning for success, decrease snacking , keep healthy foods in the home, and mindful  eating.  Adrienne Burke has agreed to follow-up with our clinic in 8 weeks.    Objective:   VITALS: Per patient if applicable, see vitals. GENERAL: Alert and in no acute distress. CARDIOPULMONARY: No increased WOB. Speaking in clear sentences.  PSYCH: Pleasant and cooperative. Speech normal rate and rhythm. Affect is appropriate. Insight and judgement are appropriate. Attention is focused, linear, and appropriate.  NEURO: Oriented as arrived to appointment on time with no prompting.   Attestation Statements:    This was prepared with the assistance of Engineer, civil (consulting).  Occasional wrong-word or sound-a-like substitutions may have occurred due to the inherent limitations of voice recognition   Adrienne Capra, DO

## 2023-11-26 ENCOUNTER — Encounter: Payer: Self-pay | Admitting: Internal Medicine

## 2023-11-26 ENCOUNTER — Encounter: Payer: Self-pay | Admitting: Bariatrics

## 2023-11-26 ENCOUNTER — Encounter (INDEPENDENT_AMBULATORY_CARE_PROVIDER_SITE_OTHER): Payer: Self-pay

## 2023-11-26 LAB — COMPREHENSIVE METABOLIC PANEL
ALT: 7 [IU]/L (ref 0–32)
AST: 18 [IU]/L (ref 0–40)
Albumin: 4.5 g/dL (ref 3.8–4.9)
Alkaline Phosphatase: 75 [IU]/L (ref 44–121)
BUN/Creatinine Ratio: 16 (ref 12–28)
BUN: 14 mg/dL (ref 8–27)
Bilirubin Total: 0.4 mg/dL (ref 0.0–1.2)
CO2: 23 mmol/L (ref 20–29)
Calcium: 10.1 mg/dL (ref 8.7–10.3)
Chloride: 101 mmol/L (ref 96–106)
Creatinine, Ser: 0.9 mg/dL (ref 0.57–1.00)
Globulin, Total: 2.4 g/dL (ref 1.5–4.5)
Glucose: 85 mg/dL (ref 70–99)
Potassium: 5.3 mmol/L — ABNORMAL HIGH (ref 3.5–5.2)
Sodium: 139 mmol/L (ref 134–144)
Total Protein: 6.9 g/dL (ref 6.0–8.5)
eGFR: 73 mL/min/{1.73_m2} (ref >59)

## 2023-11-26 LAB — HEMOGLOBIN A1C
Est. average glucose Bld gHb Est-mCnc: 120 mg/dL
Hgb A1c MFr Bld: 5.8 % — ABNORMAL HIGH (ref 4.8–5.6)

## 2023-11-26 LAB — LIPID PANEL WITH LDL/HDL RATIO
Cholesterol, Total: 218 mg/dL — ABNORMAL HIGH (ref 100–199)
HDL: 68 mg/dL (ref >39)
LDL Chol Calc (NIH): 136 mg/dL — ABNORMAL HIGH (ref 0–99)
LDL/HDL Ratio: 2 {ratio} (ref 0.0–3.2)
Triglycerides: 81 mg/dL (ref 0–149)
VLDL Cholesterol Cal: 14 mg/dL (ref 5–40)

## 2023-11-26 LAB — INSULIN, RANDOM: INSULIN: 5.3 u[IU]/mL (ref 2.6–24.9)

## 2023-11-26 LAB — VITAMIN D 25 HYDROXY (VIT D DEFICIENCY, FRACTURES): Vit D, 25-Hydroxy: 70.4 ng/mL (ref 30.0–100.0)

## 2023-12-08 ENCOUNTER — Ambulatory Visit (INDEPENDENT_AMBULATORY_CARE_PROVIDER_SITE_OTHER): Payer: Commercial Managed Care - PPO

## 2023-12-08 ENCOUNTER — Ambulatory Visit (INDEPENDENT_AMBULATORY_CARE_PROVIDER_SITE_OTHER): Payer: Commercial Managed Care - PPO | Admitting: Internal Medicine

## 2023-12-08 ENCOUNTER — Encounter: Payer: Self-pay | Admitting: Internal Medicine

## 2023-12-08 ENCOUNTER — Other Ambulatory Visit: Payer: Self-pay | Admitting: Internal Medicine

## 2023-12-08 VITALS — BP 100/80 | HR 63 | Temp 98.4°F | Ht 65.0 in | Wt 157.0 lb

## 2023-12-08 DIAGNOSIS — J069 Acute upper respiratory infection, unspecified: Secondary | ICD-10-CM

## 2023-12-08 DIAGNOSIS — J3489 Other specified disorders of nose and nasal sinuses: Secondary | ICD-10-CM | POA: Insufficient documentation

## 2023-12-08 LAB — POC COVID19 BINAXNOW: SARS Coronavirus 2 Ag: NEGATIVE

## 2023-12-08 LAB — POCT INFLUENZA A/B
Influenza A, POC: NEGATIVE
Influenza B, POC: NEGATIVE

## 2023-12-08 MED ORDER — MONTELUKAST SODIUM 10 MG PO TABS: 10.0000 mg | ORAL_TABLET | Freq: Every day | ORAL | 3 refills | Status: DC

## 2023-12-08 NOTE — Progress Notes (Signed)
   Subjective:   Patient ID: Adrienne Burke, female    DOB: 1961/01/18, 62 y.o.   MRN: [id number]  HPI The patient is a 62 YO female coming in for sinus symptoms ongoing for a month or so. With head pain and some stomach problems as well. Seen at urgent care 12/21 and given doxycycline  for 1 week. This did improve symptoms but not all the way gone. In last 1-2 days she has had new severe symptoms of flu-like symptoms with head pain, body aches, fevers, cough. No SOB. Is weaning down on zepbound  and is unsure if that is causing some of head pressure longer term.   Review of Systems  Constitutional:  Positive for activity change, appetite change, chills and fever. Negative for fatigue and unexpected weight change.  HENT:  Positive for congestion, postnasal drip, rhinorrhea, sinus pressure and sore throat. Negative for ear discharge, ear pain, sinus pain, sneezing, tinnitus, trouble swallowing and voice change.   Eyes: Negative.   Respiratory:  Positive for cough. Negative for chest tightness, shortness of breath and wheezing.   Cardiovascular: Negative.   Gastrointestinal: Negative.   Musculoskeletal:  Positive for myalgias.  Neurological: Negative.     Objective:  Physical Exam Constitutional:      Appearance: She is well-developed.  HENT:     Head: Normocephalic and atraumatic.     Comments: Oropharynx with redness and clear drainage, nose with swollen turbinates, TMs normal bilaterally.  Neck:     Thyroid : No thyromegaly.  Cardiovascular:     Rate and Rhythm: Normal rate and regular rhythm.  Pulmonary:     Effort: Pulmonary effort is normal. No respiratory distress.     Breath sounds: Normal breath sounds. No wheezing or rales.  Abdominal:     Palpations: Abdomen is soft.  Musculoskeletal:        General: Tenderness present.     Cervical back: Normal range of motion.  Lymphadenopathy:     Cervical: No cervical adenopathy.  Skin:    General: Skin is warm and dry.   Neurological:     Mental Status: She is alert and oriented to person, place, and time.     Vitals:   12/08/23 1214  BP: 100/80  Pulse: 63  Temp: 98.4 F (36.9 C)  TempSrc: Oral  SpO2: 99%  Weight: 157 lb (71.2 kg)  Height: 5' 5 (1.651 m)    Assessment & Plan:

## 2023-12-08 NOTE — Assessment & Plan Note (Addendum)
 Doing POC flu and covid-19 testing. Suspect she has a viral URI on top of chronic sinus issues. No signs of sinus infection today. Lungs are clear. Both flu and covid-19 are negative. She is requesting antibiotics however I see no convincing sign of bacterial infection. Offered CXR to rule out atypical/walking pneumonia and this is ordered.

## 2023-12-08 NOTE — Assessment & Plan Note (Signed)
 Suspect this is a separate issue from her acute URI and present for 1 month. With some improvement with doxycycline . She is taking antihistamines otc and sudafed and nasacort  she tried but this did not help pressure. Will add singulair  10 mg daily and prescribed.

## 2023-12-08 NOTE — Patient Instructions (Signed)
We have sent in the singulair to take daily.   We will check the chest x-ray today.

## 2023-12-10 ENCOUNTER — Encounter: Payer: Self-pay | Admitting: Internal Medicine

## 2023-12-16 ENCOUNTER — Telehealth: Payer: Self-pay | Admitting: Internal Medicine

## 2023-12-16 NOTE — Telephone Encounter (Signed)
 PDMP reviewed, Rx sent

## 2023-12-16 NOTE — Telephone Encounter (Signed)
 Requesting: lorazepam 0.5mg   Contract: 01/27/23 UDS: 07/09/22 Last Visit: 05/08/23 Next Visit: 12/28/23 Last Refill: 08/19/23 #30 and 3RF  Please Advise

## 2023-12-28 ENCOUNTER — Encounter: Payer: Self-pay | Admitting: Bariatrics

## 2023-12-28 ENCOUNTER — Ambulatory Visit (INDEPENDENT_AMBULATORY_CARE_PROVIDER_SITE_OTHER): Payer: Commercial Managed Care - PPO | Admitting: Internal Medicine

## 2023-12-28 ENCOUNTER — Encounter: Payer: Self-pay | Admitting: Internal Medicine

## 2023-12-28 VITALS — BP 126/68 | HR 63 | Temp 97.5°F | Resp 16 | Ht 65.0 in | Wt 155.2 lb

## 2023-12-28 DIAGNOSIS — F32A Depression, unspecified: Secondary | ICD-10-CM

## 2023-12-28 DIAGNOSIS — Z23 Encounter for immunization: Secondary | ICD-10-CM | POA: Diagnosis not present

## 2023-12-28 DIAGNOSIS — F419 Anxiety disorder, unspecified: Secondary | ICD-10-CM | POA: Diagnosis not present

## 2023-12-28 DIAGNOSIS — Z79899 Other long term (current) drug therapy: Secondary | ICD-10-CM | POA: Diagnosis not present

## 2023-12-28 DIAGNOSIS — Z0000 Encounter for general adult medical examination without abnormal findings: Secondary | ICD-10-CM | POA: Diagnosis not present

## 2023-12-28 LAB — CBC WITH DIFFERENTIAL/PLATELET
Basophils Absolute: 0 10*3/uL (ref 0.0–0.1)
Basophils Relative: 0.7 % (ref 0.0–3.0)
Eosinophils Absolute: 0.1 10*3/uL (ref 0.0–0.7)
Eosinophils Relative: 1 % (ref 0.0–5.0)
HCT: 38.1 % (ref 36.0–46.0)
Hemoglobin: 12.7 g/dL (ref 12.0–15.0)
Lymphocytes Relative: 30.5 % (ref 12.0–46.0)
Lymphs Abs: 1.8 10*3/uL (ref 0.7–4.0)
MCHC: 33.3 g/dL (ref 30.0–36.0)
MCV: 93.4 fL (ref 78.0–100.0)
Monocytes Absolute: 0.3 10*3/uL (ref 0.1–1.0)
Monocytes Relative: 5.6 % (ref 3.0–12.0)
Neutro Abs: 3.8 10*3/uL (ref 1.4–7.7)
Neutrophils Relative %: 62.2 % (ref 43.0–77.0)
Platelets: 292 10*3/uL (ref 150.0–400.0)
RBC: 4.07 Mil/uL (ref 3.87–5.11)
RDW: 13.8 % (ref 11.5–15.5)
WBC: 6.1 10*3/uL (ref 4.0–10.5)

## 2023-12-28 NOTE — Assessment & Plan Note (Signed)
Here for CPX -Td 2025  - s/p shingrix - Rec flu shot, covid booster.  Patient does not seem inclined to proceed. -Female care: [facility] 03/2023([facility]) PAP 2023 DEXA  2019 ([facility]), had a DEXA 2024 at gyn, ostepenia  --Cscope 11-05 + polyps, C-scope 09/2009, normal.  C-scope 11/2019, next 2027 per GI letter  --Style: Continue exercising 3 times a week  - labs: Reviewed, will get a CBC.

## 2023-12-28 NOTE — Progress Notes (Signed)
Subjective:    Patient ID: Adrienne Burke, female    DOB: [birth year], 63 y.o.   MRN: [id number]  DOS:  12/28/2023 Type of visit - description: cpx  Here for CPX Since the last visit is doing well, + weight loss, on Zepbound. Occasionally has pain at the low back, mostly when she stands up, while she starts walking the pain goes away.  Denies any lower extremity paresthesias.  Review of Systems  Other than above, a 14 point review of systems is negative    Past Medical History:  Diagnosis Date   Actinic keratitis    hx of   Anxiety    Anxiety and depression    Atrophic vaginitis    Back pain    Colon polyp    Hyperplastic polyps 10/2004, Normal Cscope 2010    Constipation    Depression    ENDOMETRIOSIS    Genital HSV    HYPERLIPIDEMIA    Insomnia    Ovarian cyst    bilateral, s/p drainage    Past Surgical History:  Procedure Laterality Date   ANKLE FRACTURE SURGERY     APPENDECTOMY     BREAST IMPLANT EXCHANGE     breast lift-implant 2009, repeated 08-2009     COLONOSCOPY  2010,2015   EYE SURGERY     PRK for vision correction 02-2012   POLYPECTOMY  2015   adenoma polyp   TONSILLECTOMY AND ADENOIDECTOMY  1966   TUBAL LIGATION     uterine ablation     Social History   Socioeconomic History   Marital status: Married    Spouse name: Not on file   Number of children: 2   Years of education: Not on file   Highest education level: Master's degree (e.g., MA, MS, MEng, MEd, MSW, MBA)  Occupational History   Occupation: pay Licensed conveyancer  Tobacco Use   Smoking status: Former    Current packs/day: 0.00    Average packs/day: 0.5 packs/day for 16.0 years (8.0 ttl pk-yrs)    Types: Cigarettes    Start date: 84    Quit date: 1996    Years since quitting: 29.0   Smokeless tobacco: Never   Tobacco comments:    quit 1997  Vaping Use   Vaping status: Never Used  Substance and Sexual Activity   Alcohol use: Yes    Alcohol/week: 1.0 standard drink of alcohol     Types: 1 Standard drinks or equivalent per week    Comment: socially    Drug use: No   Sexual activity: Not on file  Other Topics Concern   Not on file  Social History Narrative   G3 P2 ab 1     Married to [place] , 1 child alive , lost one            Social Drivers of Corporate investment banker Strain: Low Risk  (05/07/2023)   Overall Financial Resource Strain (CARDIA)    Difficulty of Paying Living Expenses: Not hard at all  Food Insecurity: No Food Insecurity (05/07/2023)   Hunger Vital Sign    Worried About Running Out of Food in the Last Year: Never true    Ran Out of Food in the Last Year: Never true  Transportation Needs: No Transportation Needs (05/07/2023)   PRAPARE - Administrator, Civil Service (Medical): No    Lack of Transportation (Non-Medical): No  Physical Activity: Sufficiently Active (05/07/2023)   Exercise Vital Sign  Days of Exercise per Week: 5 days    Minutes of Exercise per Session: 50 min  Stress: No Stress Concern Present (05/07/2023)   [employer] of Occupational Health - Occupational Stress Questionnaire    Feeling of Stress : Only a little  Social Connections: Moderately Integrated (12/08/2023)   Social Connection and Isolation Panel [NHANES]    Frequency of Communication with Friends and Family: Twice a week    Frequency of Social Gatherings with Friends and Family: Twice a week    Attends Religious Services: 1 to 4 times per year    Active Member of [employer] or Organizations: No    Attends Engineer, structural: Not on file    Marital Status: Married  Catering manager Violence: Not on file     Current Outpatient Medications  Medication Instructions   atorvastatin (LIPITOR) 20 mg, Oral, Daily   D3-50 50,000 Units, Oral, Weekly   LORazepam (ATIVAN) 0.5 MG tablet TAKE 1 TABLET BY MOUTH AT BEDTIME AS NEEDED FOR ANXIETY   Multiple Vitamins-Minerals (MULTIVITAMIN,TX-MINERALS) tablet 1 tablet, Daily   sertraline (ZOLOFT)  50 mg, Oral, Daily   Zepbound 2.5 mg, Subcutaneous, Weekly       Objective:   Physical Exam BP 126/68   Pulse 63   Temp (!) 97.5 F (36.4 C) (Oral)   Resp 16   Ht 5\' 5"  (1.651 m)   Wt 155 lb 4 oz (70.4 kg)   LMP  (LMP Unknown)   SpO2 98%   BMI 25.83 kg/m  General: Well developed, NAD, BMI noted Neck: No  thyromegaly  HEENT:  Normocephalic . Face symmetric, atraumatic Lungs:  CTA B Normal respiratory effort, no intercostal retractions, no accessory muscle use. Heart: RRR,  no murmur.  Abdomen:  Not distended, soft, non-tender. No rebound or rigidity.   Lower extremities: no pretibial edema bilaterally  Skin: Exposed areas without rash. Not pale. Not jaundice Neurologic:  alert & oriented X3.  Speech normal, gait appropriate for age and unassisted Strength symmetric and appropriate for age.  Psych: Cognition and judgment appear intact.  Cooperative with normal attention span and concentration.  Behavior appropriate. No anxious or depressed appearing.     Assessment    Problem list Hyperglycemia A1c 6.0 01-2018 Hyperlipidemia Anxiety, depression  Osteopenia, Dx 2024 gynecology (per patient) H/oh endometriosis. s/p exploration , DUB, endometrial ablasion H/o actinic keratosis, sees derm q year as of 05/2018 Pulmonary nodules: Last CT 10-2020, stable.  No further scheduled CT  PLAN Here for CPX -Td 2025  - s/p shingrix - Rec flu shot, covid booster.  Patient does not seem inclined to proceed. -Female care: [facility] 03/2023([facility]) PAP 2023 DEXA  2019 ([facility]), had a DEXA 2024 at gyn, ostepenia  --Cscope 11-05 + polyps, C-scope 09/2009, normal.  C-scope 11/2019, next 2027 per GI letter  --Style: Continue exercising 3 times a week  - labs: Reviewed, will get a CBC. Chronic medical problems addressed Hyperglycemia: Last A1c satisfactory. Hyperlipidemia, on atorvastatin, last LDL higher than before, encouraged to continue with portion control, see if she can improve her  foot quality with more fruits and vegetables. Anxiety depression: On sertraline and nightly Ativan.  Check UDS. Osteopenia: Dx at gynecology, she is already taking vitamin D, trying to exercise even more. Obesity: Follow-up at the wellness clinic, on Zepbound. Vitamin D deficiency: Follow-up at the wellness clinic. RTC 1 year

## 2023-12-28 NOTE — Patient Instructions (Addendum)
It was good to see you.  Vaccines I recommend: Covid booster Flu shot       GO TO THE LAB : Get the blood work     Next visit with me in 1 year for a physical exam Please schedule it at the front desk

## 2023-12-28 NOTE — Assessment & Plan Note (Signed)
Here for CPX  Chronic medical problems addressed Hyperglycemia: Last A1c satisfactory. Hyperlipidemia, on atorvastatin, last LDL higher than before, encouraged to continue with portion control, see if she can improve her foot quality with more fruits and vegetables. Anxiety depression: On sertraline and nightly Ativan.  Check UDS. Osteopenia: Dx at gynecology, she is already taking vitamin D, trying to exercise even more. Obesity: Follow-up at the wellness clinic, on Zepbound. Vitamin D deficiency: Follow-up at the wellness clinic. RTC 1 year

## 2023-12-29 ENCOUNTER — Other Ambulatory Visit: Payer: Self-pay | Admitting: Bariatrics

## 2023-12-29 DIAGNOSIS — Z6825 Body mass index (BMI) 25.0-25.9, adult: Secondary | ICD-10-CM

## 2023-12-29 DIAGNOSIS — E669 Obesity, unspecified: Secondary | ICD-10-CM

## 2023-12-29 MED ORDER — TIRZEPATIDE-WEIGHT MANAGEMENT 2.5 MG/0.5ML ~~LOC~~ SOAJ: 2.5000 mg | SUBCUTANEOUS | 0 refills | Status: DC

## 2023-12-30 ENCOUNTER — Encounter: Payer: Self-pay | Admitting: Internal Medicine

## 2023-12-30 ENCOUNTER — Other Ambulatory Visit: Payer: Self-pay | Admitting: Bariatrics

## 2023-12-30 ENCOUNTER — Other Ambulatory Visit (HOSPITAL_BASED_OUTPATIENT_CLINIC_OR_DEPARTMENT_OTHER): Payer: Self-pay

## 2023-12-30 DIAGNOSIS — E669 Obesity, unspecified: Secondary | ICD-10-CM

## 2023-12-30 DIAGNOSIS — Z6825 Body mass index (BMI) 25.0-25.9, adult: Secondary | ICD-10-CM

## 2023-12-30 MED ORDER — TIRZEPATIDE-WEIGHT MANAGEMENT 2.5 MG/0.5ML ~~LOC~~ SOAJ
2.5000 mg | SUBCUTANEOUS | 0 refills | Status: DC
  Filled 2023-12-30: qty 2, 28d supply, fill #0

## 2024-01-01 LAB — DM TEMPLATE

## 2024-01-02 LAB — DRUG MONITORING PANEL 375977 , URINE
Alphahydroxyalprazolam: NEGATIVE ng/mL (ref <25)
Alphahydroxymidazolam: NEGATIVE ng/mL (ref <50)
Alphahydroxytriazolam: NEGATIVE ng/mL (ref <50)
Aminoclonazepam: NEGATIVE ng/mL (ref <25)
Barbiturates: NEGATIVE ng/mL (ref <300)
Benzodiazepines: POSITIVE ng/mL — AB (ref <100)
Cocaine Metabolite: NEGATIVE ng/mL (ref <150)
Desmethyltramadol: NEGATIVE ng/mL (ref <100)
Hydroxyethylflurazepam: NEGATIVE ng/mL (ref <50)
Lorazepam: 436 ng/mL — ABNORMAL HIGH (ref <50)
Marijuana Metabolite: NEGATIVE ng/mL (ref <20)
Nordiazepam: NEGATIVE ng/mL (ref <50)
Opiates: NEGATIVE ng/mL (ref <100)
Oxazepam: NEGATIVE ng/mL (ref <50)
Oxycodone: NEGATIVE ng/mL (ref <100)
Temazepam: NEGATIVE ng/mL (ref <50)
Tramadol Comments: NEGATIVE ng/mL (ref <500)
Tramadol: NEGATIVE ng/mL (ref <100)
Tramadol: NEGATIVE ng/mL (ref <500)
medMATCH Summary: NEGATIVE ng/mL (ref <100)

## 2024-01-02 LAB — DM TEMPLATE

## 2024-01-13 ENCOUNTER — Ambulatory Visit (INDEPENDENT_AMBULATORY_CARE_PROVIDER_SITE_OTHER): Payer: Commercial Managed Care - PPO

## 2024-01-13 ENCOUNTER — Ambulatory Visit (INDEPENDENT_AMBULATORY_CARE_PROVIDER_SITE_OTHER): Payer: Commercial Managed Care - PPO | Admitting: Sports Medicine

## 2024-01-13 VITALS — BP 102/60 | HR 78 | Ht 65.0 in | Wt 156.8 lb

## 2024-01-13 DIAGNOSIS — G8929 Other chronic pain: Secondary | ICD-10-CM | POA: Diagnosis not present

## 2024-01-13 DIAGNOSIS — M545 Low back pain, unspecified: Secondary | ICD-10-CM

## 2024-01-13 DIAGNOSIS — M5442 Lumbago with sciatica, left side: Secondary | ICD-10-CM

## 2024-01-13 DIAGNOSIS — M25552 Pain in left hip: Secondary | ICD-10-CM

## 2024-01-13 MED ORDER — MELOXICAM 15 MG PO TABS: 15.0000 mg | ORAL_TABLET | Freq: Every day | ORAL | 0 refills | Status: DC

## 2024-01-13 NOTE — Patient Instructions (Addendum)
-   Start meloxicam  15 mg daily x2 weeks.  If still having pain after 2 weeks, complete 3rd-week of NSAID. May use remaining NSAID as needed once daily for pain control.  Do not to use additional over-the-counter NSAIDs (ibuprofen, naproxen, Advil, Aleve) while taking prescription NSAIDs.  May use Tylenol [id number] mg 2 to 3 times a day for breakthrough pain.  Low back and hip Hep. 4 week follow up.

## 2024-01-13 NOTE — Progress Notes (Signed)
 Ben Averie Meiner D.[facility] [address] [place] [address] Phone: [phone]   Assessment and Plan:     1. Chronic left-sided low back pain with left-sided sciatica (Primary) 2. 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  My interpretation: No acute fracture, dislocation, or vertebral collapse.  Degenerative disc disease most advanced at L4-L5 and L5-S1.  Anterolisthesis L3 on L4 and L5 on S1 - Start HEP for low back and hip  - Start meloxicam  15 mg daily x2 weeks.  If still having pain after 2 weeks, complete 3rd-week of NSAID. May use remaining NSAID as needed once daily for pain control.  Do not to use additional over-the-counter NSAIDs (ibuprofen, naproxen, Advil, Aleve) while taking prescription NSAIDs.  May use Tylenol [phone] mg 2 to 3 times a day for breakthrough pain.  Pertinent previous records reviewed include none  Follow Up: 4 weeks for reevaluation.  Could further discuss OMT versus physical therapy   Subjective:   I, Claretha Schimke am a scribe for Dr. Leonce.   Chief Complaint: LBP, hip, L leg pain  HPI:   01/13/24 Patient is 64 year old female with low back, hip and leg pain. Patient states last few months been having an issue in the back, hip, and leg. After sitting for a while hip, lower back, and leg hurt. Walking helps. Ibu some times to try to help the issue.   Relevant Historical Information: None pertinent  Additional pertinent review of systems negative.   Current Outpatient Medications:    atorvastatin  (LIPITOR) 20 MG tablet, Take 1 tablet (20 mg total) by mouth daily., Disp: 90 tablet, Rfl: 1   Cholecalciferol  (VITAMIN D3) 1.25 MG (50000 UT) CAPS, Take 1 capsule (50,000 Units total) by mouth once a week., Disp: 4 capsule, Rfl: 0   LORazepam   (ATIVAN ) 0.5 MG tablet, TAKE 1 TABLET BY MOUTH AT BEDTIME AS NEEDED FOR ANXIETY, Disp: 30 tablet, Rfl: 3   meloxicam  (MOBIC ) 15 MG tablet, Take 1 tablet (15 mg total) by mouth daily., Disp: 30 tablet, Rfl: 0   Multiple Vitamins-Minerals (MULTIVITAMIN,TX-MINERALS) tablet, Take 1 tablet by mouth daily.  , Disp: , Rfl:    sertraline  (ZOLOFT ) 50 MG tablet, Take 1 tablet (50 mg total) by mouth daily., Disp: 90 tablet, Rfl: 1   tirzepatide  (ZEPBOUND ) 2.5 MG/0.5ML Pen, Inject 2.5 mg into the skin once a week., Disp: 2 mL, Rfl: 0   Objective:     Vitals:   01/13/24 1055  BP: 102/60  Pulse: 78  SpO2: 99%  Weight: 156 lb 12.8 oz (71.1 kg)  Height: 5' 5 (1.651 m)      Body mass index is 26.09 kg/m.    Physical Exam:    General: awake, alert, and oriented no acute distress, nontoxic Skin: no suspicious lesions or rashes Neuro:sensation intact distally with no deficits, normal muscle tone, no atrophy, strength 5/5 in all tested lower ext groups Psych: normal mood and affect, speech clear   Left hip: No deformity, swelling or wasting ROM Flexion 90, ext 30, IR 45, ER 45 NTTP over the hip flexors, greater trochanter,   Negative log roll with FROM Negative FABER Negative FADIR Negative Piriformis test Positive left trendelenberg   Back - Normal skin, Spine with normal alignment and no deformity.   No tenderness to vertebral process palpation.  Bilateral lumbar paraspinous muscles are   tender and without spasm, worse on left  TTP gluteal musculature on left Straight leg raise positive left  Gait normal   Electronically signed by:  Odis Mace D.[facility] 11:37 AM 01/13/24

## 2024-01-26 ENCOUNTER — Ambulatory Visit (INDEPENDENT_AMBULATORY_CARE_PROVIDER_SITE_OTHER): Payer: Commercial Managed Care - PPO | Admitting: Bariatrics

## 2024-01-26 ENCOUNTER — Encounter: Payer: Self-pay | Admitting: Bariatrics

## 2024-01-26 ENCOUNTER — Other Ambulatory Visit (HOSPITAL_BASED_OUTPATIENT_CLINIC_OR_DEPARTMENT_OTHER): Payer: Self-pay

## 2024-01-26 VITALS — BP 101/68 | HR 57 | Temp 98.0°F | Ht 65.0 in | Wt 151.0 lb

## 2024-01-26 DIAGNOSIS — E669 Obesity, unspecified: Secondary | ICD-10-CM

## 2024-01-26 DIAGNOSIS — E785 Hyperlipidemia, unspecified: Secondary | ICD-10-CM

## 2024-01-26 DIAGNOSIS — Z6825 Body mass index (BMI) 25.0-25.9, adult: Secondary | ICD-10-CM | POA: Diagnosis not present

## 2024-01-26 DIAGNOSIS — E559 Vitamin D deficiency, unspecified: Secondary | ICD-10-CM

## 2024-01-26 MED ORDER — TIRZEPATIDE-WEIGHT MANAGEMENT 2.5 MG/0.5ML ~~LOC~~ SOAJ
2.5000 mg | SUBCUTANEOUS | 0 refills | Status: DC
  Filled 2024-01-26: qty 2, 28d supply, fill #0

## 2024-01-26 MED ORDER — VITAMIN D3 1.25 MG (50000 UT) PO CAPS
50000.0000 [IU] | ORAL_CAPSULE | ORAL | 0 refills | Status: AC
  Filled 2024-01-26: qty 12, 84d supply, fill #0

## 2024-01-26 NOTE — Progress Notes (Signed)
WEIGHT SUMMARY AND BIOMETRICS  Weight Lost Since Last Visit: 0lb  Weight Gained Since Last Visit: 0lb   Vitals Temp: 98 F (36.7 C) BP: 101/68 Pulse Rate: (!) 57 SpO2: 98 %   Anthropometric Measurements Height: 5\' 5"  (1.651 m) Weight: 151 lb (68.5 kg) BMI (Calculated): 25.13 Weight at Last Visit: 151lb Weight Lost Since Last Visit: 0lb Weight Gained Since Last Visit: 0lb Starting Weight: 187lb Total Weight Loss (lbs): 36 lb (16.3 kg)   Body Composition  Body Fat %: 30.9 % Fat Mass (lbs): 46.6 lbs Muscle Mass (lbs): 99.2 lbs Total Body Water (lbs): 68.4 lbs Visceral Fat Rating : 7   Other Clinical Data Fasting: Yes Labs: No Today's Visit #: 9 Starting Date: 03/05/23    OBESITY Adrienne Burke is here to discuss her progress with her obesity treatment plan along with follow-up of her obesity related diagnoses.    Nutrition Plan: the Category 1 plan - 75% adherence.  Current exercise: cardiovascular workout on exercise equipment, weightlifting, and dance  Interim History:  Her weight remains the same from her last vsit.  She has been able to maintain  her weight well.  Eating all of the food on the plan., Protein intake is as prescribed, Is not skipping meals, Journaling consistently., and Water intake is adequate.   Pharmacotherapy: Adrienne Burke is on Wegovy 0.25 mg SQ weekly Adverse side effects: None Hunger is well controlled.  Cravings are well controlled.  Assessment/Plan:   Hyperlipidemia LDL is not at goal. Medication(s): Lipitor Cardiovascular risk factors: dyslipidemia, obesity (BMI >= 30 kg/m2), and sedentary lifestyle  Lab Results  Component Value Date   CHOL 218 (H) 11/25/2023   HDL 68 11/25/2023   LDLCALC 136 (H) 11/25/2023   LDLDIRECT 164.0 07/16/2011   TRIG 81 11/25/2023   CHOLHDL 4 01/16/2023   Lab Results  Component Value Date    ALT 7 11/25/2023   AST 18 11/25/2023   ALKPHOS 75 11/25/2023   BILITOT 0.4 11/25/2023   The 10-year ASCVD risk score (Arnett DK, et al., 2019) is: 1.9%   Values used to calculate the score:     Age: 63 years     Sex: Female     Is Non-Hispanic African American: No     Diabetic: No     Tobacco smoker: No     Systolic Blood Pressure: 101 mmHg     Is BP treated: No     HDL Cholesterol: 68 mg/dL     Total Cholesterol: 218 mg/dL  Plan:  Continue statin.  Information sheet on healthy vs unhealthy fats.  Will avoid all trans fats.  Will read labels Will minimize saturated fats except the following: low fat meats in moderation, diary, and limited dark chocolate.   Vitamin D Deficiency Vitamin D is at goal of 50.  Most recent vitamin D level was 70.4. She is on  prescription ergocalciferol 50,000 IU weekly. Lab Results  Component Value Date   VD25OH 70.4 11/25/2023   VD25OH 22.6 (L) 03/05/2023    Plan: Refill prescription vitamin D 50,000 IU weekly.     Generalized Obesity: Current BMI BMI (Calculated): 25.13   Pharmacotherapy Plan Continue and refill  Wegovy 0.25 mg SQ weekly  Adrienne Burke is currently in the action stage of change. As such, her goal is to maintain weight for now.  She has agreed to the Category 1 plan.  Exercise goals: For substantial health benefits, adults should do at least 150 minutes (2 hours and 30 minutes) a week of moderate-intensity, or 75 minutes (1 hour and 15 minutes) a week of vigorous-intensity aerobic physical activity, or an equivalent combination of moderate- and vigorous-intensity aerobic activity. Aerobic activity should be performed in episodes of at least 10 minutes, and preferably, it should be spread throughout the week.  Behavioral modification strategies: increasing lean protein intake, decreasing simple carbohydrates , no meal skipping, decrease eating out, meal planning , increase water intake, planning for success, increasing fiber rich  foods, get rid of junk food in the home, avoiding temptations, keep healthy foods in the home, measure portion sizes, and mindful eating.  Karris has agreed to follow-up with our clinic in 3 months.     Objective:   VITALS: Per patient if applicable, see vitals. GENERAL: Alert and in no acute distress. CARDIOPULMONARY: No increased WOB. Speaking in clear sentences.  PSYCH: Pleasant and cooperative. Speech normal rate and rhythm. Affect is appropriate. Insight and judgement are appropriate. Attention is focused, linear, and appropriate.  NEURO: Oriented as arrived to appointment on time with no prompting.   Attestation Statements:   This was prepared with the assistance of Engineer, civil (consulting).  Occasional wrong-word or sound-a-like substitutions may have occurred due to the inherent limitations of voice recognition   Adrienne Capra, DO

## 2024-01-28 ENCOUNTER — Encounter: Payer: Self-pay | Admitting: Sports Medicine

## 2024-02-16 ENCOUNTER — Ambulatory Visit: Payer: Commercial Managed Care - PPO | Admitting: Sports Medicine

## 2024-02-18 ENCOUNTER — Encounter: Payer: Self-pay | Admitting: Internal Medicine

## 2024-02-18 DIAGNOSIS — Z23 Encounter for immunization: Secondary | ICD-10-CM

## 2024-02-19 NOTE — Telephone Encounter (Signed)
 Place order for MMR levels.

## 2024-02-19 NOTE — Telephone Encounter (Signed)
 Order placed

## 2024-02-19 NOTE — Addendum Note (Signed)
 Addended byConrad Cameron D on: 02/19/2024 08:24 AM   Modules accepted: Orders

## 2024-02-25 ENCOUNTER — Other Ambulatory Visit (INDEPENDENT_AMBULATORY_CARE_PROVIDER_SITE_OTHER)

## 2024-02-25 DIAGNOSIS — Z23 Encounter for immunization: Secondary | ICD-10-CM | POA: Diagnosis not present

## 2024-02-26 ENCOUNTER — Ambulatory Visit: Payer: Commercial Managed Care - PPO | Admitting: Sports Medicine

## 2024-02-26 LAB — MEASLES/MUMPS/RUBELLA IMMUNITY
Mumps IgG: >300 [AU]/ml
Rubella: 1.63 {index}
Rubeola IgG: 99.4 [AU]/ml

## 2024-02-27 ENCOUNTER — Encounter: Payer: Self-pay | Admitting: Internal Medicine

## 2024-02-29 ENCOUNTER — Other Ambulatory Visit: Payer: Self-pay | Admitting: Obstetrics and Gynecology

## 2024-02-29 DIAGNOSIS — Z0000 Encounter for general adult medical examination without abnormal findings: Secondary | ICD-10-CM

## 2024-03-02 ENCOUNTER — Encounter: Payer: Self-pay | Admitting: Internal Medicine

## 2024-04-01 ENCOUNTER — Ambulatory Visit
Admission: RE | Admit: 2024-04-01 | Discharge: 2024-04-01 | Disposition: A | Discharge status: Home / Self Care | Source: Ambulatory Visit | Attending: Obstetrics and Gynecology | Admitting: Obstetrics and Gynecology

## 2024-04-01 ENCOUNTER — Other Ambulatory Visit: Payer: Self-pay | Admitting: Obstetrics and Gynecology

## 2024-04-01 DIAGNOSIS — Z0000 Encounter for general adult medical examination without abnormal findings: Secondary | ICD-10-CM

## 2024-04-04 ENCOUNTER — Telehealth: Payer: Self-pay | Admitting: Internal Medicine

## 2024-04-04 NOTE — Telephone Encounter (Signed)
 Requesting: lorazepam  0.5mg   Contract: 01/27/23 UDS: 12/28/23 Last Visit: 12/28/23 Next Visit: 12/28/24 Last Refill: 12/16/23 #30 and 3RF   Please Advise

## 2024-04-05 ENCOUNTER — Encounter: Payer: Self-pay | Admitting: Internal Medicine

## 2024-04-05 NOTE — Telephone Encounter (Signed)
 PDMP okay, Rx sent

## 2024-04-06 ENCOUNTER — Encounter: Payer: Self-pay | Admitting: Bariatrics

## 2024-04-06 NOTE — Telephone Encounter (Signed)
 Spoke w/ [facility]- given approval to release lorazepam  a day early as Pt is going out of town.

## 2024-04-06 NOTE — Telephone Encounter (Signed)
 PDMP okay, advise pharmacy: Okay to release early

## 2024-04-06 NOTE — Telephone Encounter (Signed)
 Can you please advise?

## 2024-04-07 ENCOUNTER — Other Ambulatory Visit: Payer: Self-pay | Admitting: Bariatrics

## 2024-04-07 DIAGNOSIS — Z6825 Body mass index (BMI) 25.0-25.9, adult: Secondary | ICD-10-CM

## 2024-04-07 DIAGNOSIS — E669 Obesity, unspecified: Secondary | ICD-10-CM

## 2024-04-07 MED ORDER — TIRZEPATIDE-WEIGHT MANAGEMENT 2.5 MG/0.5ML ~~LOC~~ SOAJ: 2.5000 mg | SUBCUTANEOUS | 0 refills | Status: DC

## 2024-04-11 ENCOUNTER — Other Ambulatory Visit (HOSPITAL_BASED_OUTPATIENT_CLINIC_OR_DEPARTMENT_OTHER): Payer: Self-pay

## 2024-04-11 ENCOUNTER — Other Ambulatory Visit: Payer: Self-pay

## 2024-04-11 ENCOUNTER — Other Ambulatory Visit: Payer: Self-pay | Admitting: Bariatrics

## 2024-04-11 DIAGNOSIS — E669 Obesity, unspecified: Secondary | ICD-10-CM

## 2024-04-11 DIAGNOSIS — Z6825 Body mass index (BMI) 25.0-25.9, adult: Secondary | ICD-10-CM

## 2024-04-11 MED ORDER — TIRZEPATIDE-WEIGHT MANAGEMENT 2.5 MG/0.5ML ~~LOC~~ SOAJ
2.5000 mg | SUBCUTANEOUS | 0 refills | Status: DC
  Filled 2024-04-11: qty 2, 28d supply, fill #0

## 2024-04-25 ENCOUNTER — Ambulatory Visit: Payer: Commercial Managed Care - PPO | Admitting: Bariatrics

## 2024-04-25 ENCOUNTER — Encounter: Payer: Self-pay | Admitting: Bariatrics

## 2024-04-25 VITALS — BP 100/66 | HR 59 | Temp 97.6°F | Ht 65.0 in | Wt 152.0 lb

## 2024-04-25 DIAGNOSIS — E669 Obesity, unspecified: Secondary | ICD-10-CM

## 2024-04-25 DIAGNOSIS — Z6825 Body mass index (BMI) 25.0-25.9, adult: Secondary | ICD-10-CM | POA: Diagnosis not present

## 2024-04-25 DIAGNOSIS — R632 Polyphagia: Secondary | ICD-10-CM | POA: Diagnosis not present

## 2024-04-25 NOTE — Progress Notes (Signed)
 WEIGHT SUMMARY AND BIOMETRICS  Weight Lost Since Last Visit: 0  Weight Gained Since Last Visit: 1lb   Vitals Temp: 97.6 F (36.4 C) BP: 100/66 Pulse Rate: (!) 59 SpO2: 98 %   Anthropometric Measurements Height: 5\' 5"  (1.651 m) Weight: 152 lb (68.9 kg) BMI (Calculated): 25.29 Weight at Last Visit: 151lb Weight Lost Since Last Visit: 0 Weight Gained Since Last Visit: 1lb Starting Weight: 187lb Total Weight Loss (lbs): 35 lb (15.9 kg)   Body Composition  Body Fat %: 31.9 % Fat Mass (lbs): 48.4 lbs Muscle Mass (lbs): 98.4 lbs Total Body Water (lbs): 68.6 lbs Visceral Fat Rating : 7   Other Clinical Data Fasting: yes Labs: no Today's Visit #: 10 Starting Date: 03/05/23    OBESITY Gulianna is here to discuss her progress with her obesity treatment plan along with follow-up of her obesity related diagnoses.    Nutrition Plan: the Category 1 plan - 50% adherence.  Current exercise: cardiovascular workout on exercise equipment, weightlifting, and line dancing.  Interim History:  She is up 1 lb since her last visit. She is maintaining her weight well.  Protein intake is as prescribed, Is not skipping meals, Journaling consistently., and Water intake is adequate.   Pharmacotherapy: Emer is on Zepbound  2.5 mg into the skin weekly Adverse side effects: None Hunger is moderately controlled.  Cravings are well controlled.  Assessment/Plan:   Kyana Aicher endorses excessive hunger.  She states that the medication is working well and she is attempting to wean herself from the Zepbound  at this time, but is continuing usage.  She is doing this at this time because she feels like she is at a good weight Medication(s): Zepbound  Effects of medication (appetite):  moderately controlled. Cravings are well controlled.   Plan: Medication(s): Zepbound  2.5 mg SQ  weekly Will increase water, protein and fiber to help assuage hunger.  Will minimize foods that have a high glucose index/load to minimize reactive hypoglycemia.  She will weigh herself weekly. She will minimize sweets and starchy carbohydrates. She is planning to wean herself from the Zepbound  over time, and we will see how she does at her next visit as her insurance will no longer cover Zepbound  after July 1.    Generalized Obesity: Current BMI BMI (Calculated): 25.29   Pharmacotherapy Plan Continue  Zepbound  2.5 mg SQ weekly  Carlee is currently in the action stage of change. As such, her goal is to maintain weight for now.  She has agreed to the Category 1 plan.  Exercise goals: All adults should avoid inactivity. Some physical activity is better than none, and adults who participate in any amount of physical activity gain some health benefits. She is doing cardio, bike riding, and line dancing.   Behavioral modification strategies: decreasing simple carbohydrates , no meal skipping, meal planning , increase water intake, better snacking choices,  planning for success, increasing vegetables, avoiding temptations, keep healthy foods in the home, work on smaller portions, and mindful eating.  Shyann has agreed to follow-up with our clinic in 4 weeks.    Objective:   VITALS: Per patient if applicable, see vitals. GENERAL: Alert and in no acute distress. CARDIOPULMONARY: No increased WOB. Speaking in clear sentences.  PSYCH: Pleasant and cooperative. Speech normal rate and rhythm. Affect is appropriate. Insight and judgement are appropriate. Attention is focused, linear, and appropriate.  NEURO: Oriented as arrived to appointment on time with no prompting.   Attestation Statements:   This was prepared with the assistance of Engineer, civil (consulting).  Occasional wrong-word or sound-a-like substitutions may have occurred due to the inherent limitations of voice recognition   Kirk Peper,  DO

## 2024-05-03 ENCOUNTER — Other Ambulatory Visit: Payer: Self-pay | Admitting: Bariatrics

## 2024-05-03 ENCOUNTER — Other Ambulatory Visit (HOSPITAL_BASED_OUTPATIENT_CLINIC_OR_DEPARTMENT_OTHER): Payer: Self-pay

## 2024-05-03 DIAGNOSIS — Z6825 Body mass index (BMI) 25.0-25.9, adult: Secondary | ICD-10-CM

## 2024-05-03 DIAGNOSIS — E669 Obesity, unspecified: Secondary | ICD-10-CM

## 2024-05-03 MED ORDER — TIRZEPATIDE-WEIGHT MANAGEMENT 2.5 MG/0.5ML ~~LOC~~ SOAJ
2.5000 mg | SUBCUTANEOUS | 0 refills | Status: DC
Start: 2024-05-03 — End: 2024-06-06
  Filled 2024-05-03: qty 2, 28d supply, fill #0

## 2024-05-14 ENCOUNTER — Other Ambulatory Visit: Payer: Self-pay | Admitting: Internal Medicine

## 2024-06-04 ENCOUNTER — Encounter: Payer: Self-pay | Admitting: Bariatrics

## 2024-06-06 ENCOUNTER — Other Ambulatory Visit: Payer: Self-pay | Admitting: Bariatrics

## 2024-06-06 ENCOUNTER — Other Ambulatory Visit (HOSPITAL_BASED_OUTPATIENT_CLINIC_OR_DEPARTMENT_OTHER): Payer: Self-pay

## 2024-06-06 DIAGNOSIS — Z6825 Body mass index (BMI) 25.0-25.9, adult: Secondary | ICD-10-CM

## 2024-06-06 DIAGNOSIS — E669 Obesity, unspecified: Secondary | ICD-10-CM

## 2024-06-06 MED ORDER — TIRZEPATIDE-WEIGHT MANAGEMENT 2.5 MG/0.5ML ~~LOC~~ SOAJ
2.5000 mg | SUBCUTANEOUS | 0 refills | Status: DC
Start: 2024-06-06 — End: 2024-10-03
  Filled 2024-06-06: qty 2, 28d supply, fill #0

## 2024-07-16 ENCOUNTER — Other Ambulatory Visit: Payer: Self-pay | Admitting: Internal Medicine

## 2024-07-26 ENCOUNTER — Ambulatory Visit (INDEPENDENT_AMBULATORY_CARE_PROVIDER_SITE_OTHER): Admitting: Bariatrics

## 2024-07-26 ENCOUNTER — Encounter: Payer: Self-pay | Admitting: Bariatrics

## 2024-07-26 VITALS — BP 101/67 | HR 63 | Temp 97.7°F | Ht 65.0 in | Wt 154.0 lb

## 2024-07-26 DIAGNOSIS — Z6825 Body mass index (BMI) 25.0-25.9, adult: Secondary | ICD-10-CM | POA: Diagnosis not present

## 2024-07-26 DIAGNOSIS — E669 Obesity, unspecified: Secondary | ICD-10-CM

## 2024-07-26 DIAGNOSIS — R7303 Prediabetes: Secondary | ICD-10-CM

## 2024-07-26 NOTE — Progress Notes (Signed)
 WEIGHT SUMMARY AND BIOMETRICS  Weight Lost Since Last Visit: 0  Weight Gained Since Last Visit: 2lb   Vitals Temp: 97.7 F (36.5 C) BP: 101/67 Pulse Rate: 63 SpO2: 99 %   Anthropometric Measurements Height: 5' 5 (1.651 m) Weight: 154 lb (69.9 kg) BMI (Calculated): 25.63 Weight at Last Visit: 152lb Weight Lost Since Last Visit: 0 Weight Gained Since Last Visit: 2lb Starting Weight: 187lb Total Weight Loss (lbs): 33 lb (15 kg)   Body Composition  Body Fat %: 32 % Fat Mass (lbs): 49.4 lbs Muscle Mass (lbs): 99.8 lbs Total Body Water (lbs): 69.6 lbs Visceral Fat Rating : 8   Other Clinical Data Fasting: yes Labs: no Today's Visit #: 11 Starting Date: 03/05/23    OBESITY Brenleigh is here to discuss her progress with her obesity treatment plan along with follow-up of her obesity related diagnoses.    Nutrition Plan: the Category 1 plan - 50% adherence.  Current exercise: bicycling, walking, weightlifting, and yoga  Interim History:  Her weight remains the same.  Eating all of the food on the plan., Protein intake is as prescribed, Is skipping meals, and Water intake is adequate.   Pharmacotherapy: Merriam is on Zepbound  2.5 mg SQ weekly Adverse side effects: None Hunger is moderately controlled.  Cravings are moderately controlled.  Assessment/Plan:   Prediabetes Last A1c was 5.8  Medication(s): Zepbound  2.5 mg SQ weekly Lab Results  Component Value Date   HGBA1C 5.8 (H) 11/25/2023   HGBA1C 5.8 05/08/2023   HGBA1C 6.2 01/16/2023   HGBA1C 6.3 07/09/2022   HGBA1C 6.1 01/07/2022   Lab Results  Component Value Date   INSULIN  5.3 11/25/2023   INSULIN  7.1 03/05/2023    Plan: Will minimize all refined carbohydrates both sweets and starches.  Will work on the plan and exercise.  Consider both aerobic and resistance training.  Will keep  protein, water, and fiber intake high.  Increase Polyunsaturated and Monounsaturated fats to increase satiety and encourage weight loss. She will be aware of her sweets at night and will pick good choices.  We discussed healthier types of ice cream and other snacks. Continue Zepbound  2.5 mg SQ weekly     Generalized Obesity: Current BMI BMI (Calculated): 25.63   Pharmacotherapy Plan Continue  Zepbound  2.5 mg SQ weekly  Terica is currently in the action stage of change. As such, her goal is to maintain weight for now.  She has agreed to the Category 1 plan.  Exercise goals: All adults should avoid inactivity. Some physical activity is better than none, and adults who participate in any amount of physical activity gain some health benefits.  Behavioral modification strategies: increasing lean protein intake, decreasing simple carbohydrates , no meal skipping, meal planning , better snacking choices, planning for success, increasing fiber rich foods, keep healthy foods in the home, and mindful eating.  Ronal  has agreed to follow-up with our clinic in 3 months.   No orders of the defined types were placed in this encounter.   Medications Discontinued During This Encounter  Medication Reason   meloxicam  (MOBIC ) 15 MG tablet Patient Preference     No orders of the defined types were placed in this encounter.     Objective:   VITALS: Per patient if applicable, see vitals. GENERAL: Alert and in no acute distress. CARDIOPULMONARY: No increased WOB. Speaking in clear sentences.  PSYCH: Pleasant and cooperative. Speech normal rate and rhythm. Affect is appropriate. Insight and judgement are appropriate. Attention is focused, linear, and appropriate.  NEURO: Oriented as arrived to appointment on time with no prompting.   Attestation Statements:   This was prepared with the assistance of Engineer, civil (consulting).  Occasional wrong-word or sound-a-like substitutions may have occurred due to  the inherent limitations of voice recognition   Clayborne Daring, DO

## 2024-09-01 ENCOUNTER — Telehealth: Payer: Self-pay | Admitting: Internal Medicine

## 2024-09-01 NOTE — Telephone Encounter (Signed)
 Requesting: lorazepam  0.5mg   Contract:01/27/23 UDS:12/28/23 Last Visit: 12/28/23 Next Visit: 12/28/24 Last Refill: 04/05/24 #30 and 4RF   Please Advise

## 2024-09-01 NOTE — Telephone Encounter (Signed)
 PDMP okay, Rx sent

## 2024-10-03 ENCOUNTER — Encounter: Payer: Self-pay | Admitting: Bariatrics

## 2024-10-03 ENCOUNTER — Other Ambulatory Visit: Payer: Self-pay | Admitting: Bariatrics

## 2024-10-03 DIAGNOSIS — E669 Obesity, unspecified: Secondary | ICD-10-CM

## 2024-10-03 DIAGNOSIS — Z6825 Body mass index (BMI) 25.0-25.9, adult: Secondary | ICD-10-CM

## 2024-10-03 MED ORDER — TIRZEPATIDE-WEIGHT MANAGEMENT 2.5 MG/0.5ML ~~LOC~~ SOAJ: 2.5000 mg | SUBCUTANEOUS | 0 refills | Status: DC

## 2024-10-05 ENCOUNTER — Other Ambulatory Visit: Payer: Self-pay | Admitting: Bariatrics

## 2024-10-05 MED ORDER — TIRZEPATIDE-WEIGHT MANAGEMENT 2.5 MG/0.5ML ~~LOC~~ SOLN: 2.5000 mg | SUBCUTANEOUS | 0 refills | Status: DC

## 2024-10-19 ENCOUNTER — Ambulatory Visit: Payer: Self-pay

## 2024-10-19 NOTE — Telephone Encounter (Signed)
 FYI Only or Action Required?: FYI only for provider: appointment scheduled on 11/13.  Patient was last seen in primary care on 07/26/2024 by Delores Shields A, DO.  Called Nurse Triage reporting Dysuria.  Symptoms began yesterday.  Interventions attempted: Nothing.  Symptoms are: gradually worsening.  Triage Disposition: See Physician Within 24 Hours  Patient/caregiver understands and will follow disposition?: Yes      Copied from CRM #[id number]. Topic: Clinical - Red Word Triage >> Oct 19, 2024  3:32 PM Adrienne Burke wrote: Red Word that prompted transfer to Nurse Triage:when she urinates she is having burning and pain. Patient also states she is having pressure in her lower back and abdomen       Reason for Disposition  Age > 50 years  Answer Assessment - Initial Assessment Questions 1. SEVERITY: How bad is the pain?  (e.g., Scale 1-10; mild, moderate, or severe)     4/10 2. FREQUENCY: How many times have you had painful urination today?      Did not specify  3. PATTERN: Is pain present every time you urinate or just sometimes?      Every urination  4. ONSET: When did the painful urination start?      Yesterday  5. FEVER: Do you have a fever? If Yes, ask: What is your temperature, how was it measured, and when did it start?     Feels like she might have  fever but has not checked  6. PAST UTI: Have you had a urine infection before? If Yes, ask: When was the last time? and What happened that time?      Yes 7. CAUSE: What do you think is causing the painful urination?  (e.g., UTI, scratch, Herpes sore)     UTI 8. OTHER SYMPTOMS: Do you have any other symptoms? (e.g., blood in urine, flank pain, genital sores, urgency, vaginal discharge)     Lower back pain, lower abdominal pressure  Protocols used: Urination Pain - Female-A-AH

## 2024-10-20 ENCOUNTER — Other Ambulatory Visit (HOSPITAL_BASED_OUTPATIENT_CLINIC_OR_DEPARTMENT_OTHER): Payer: Self-pay

## 2024-10-20 ENCOUNTER — Ambulatory Visit (INDEPENDENT_AMBULATORY_CARE_PROVIDER_SITE_OTHER): Admitting: Family Medicine

## 2024-10-20 ENCOUNTER — Encounter: Payer: Self-pay | Admitting: Family Medicine

## 2024-10-20 VITALS — BP 98/64 | HR 68 | Temp 98.1°F | Resp 16 | Ht 65.0 in | Wt 161.4 lb

## 2024-10-20 DIAGNOSIS — R300 Dysuria: Secondary | ICD-10-CM | POA: Diagnosis not present

## 2024-10-20 LAB — POC URINALSYSI DIPSTICK (AUTOMATED)
Bilirubin, UA: NEGATIVE
Blood, UA: NEGATIVE
Glucose, UA: NEGATIVE
Ketones, UA: NEGATIVE
Nitrite, UA: NEGATIVE
Protein, UA: NEGATIVE
Spec Grav, UA: <=1.005 — AB (ref 1.010–1.025)
Urobilinogen, UA: 0.2 U/dL
pH, UA: 6.5 (ref 5.0–8.0)

## 2024-10-20 MED ORDER — PHENAZOPYRIDINE HCL 200 MG PO TABS
200.0000 mg | ORAL_TABLET | Freq: Three times a day (TID) | ORAL | 0 refills | Status: AC | PRN
  Filled 2024-10-20: qty 6, 2d supply, fill #0

## 2024-10-20 MED ORDER — NITROFURANTOIN MONOHYD MACRO 100 MG PO CAPS
100.0000 mg | ORAL_CAPSULE | Freq: Two times a day (BID) | ORAL | 0 refills | Status: DC
  Filled 2024-10-20: qty 14, 7d supply, fill #0

## 2024-10-20 NOTE — Progress Notes (Signed)
 Subjective:    Patient ID: Adrienne Burke, female    DOB: 23-Apr-1961, 63 y.o.   MRN: [id number]  Chief Complaint  Patient presents with   Dysuria    Sxs since yesterday,     HPI Patient is in today for possible uti.  Discussed the use of AI scribe software for clinical note transcription with the patient, who gave verbal consent to proceed.  History of Present Illness Adrienne Burke is a 63 year old female who presents with lower back pain and painful urination.  She has been experiencing lower back pain for the past week, which began after pulling her granddaughter. The pain is persistent and involves the lower back and lower abdomen. It does not worsen with movement.  In addition to the back pain, she has been experiencing dysuria and lower abdominal pain. The dysuria is described as burning, with the most severe symptoms occurring yesterday, characterized by frequent urination and a burning sensation. Today, the symptoms have slightly improved but remain present. The burning sensation persists even when not urinating, causing significant discomfort.  A recent urine test revealed a small amount of bacteria, and a culture has been sent for further analysis. She has not had a urinary tract infection in a Guidice time and is unsure of the cause. She is allergic to penicillin and sulfa drugs, which limits her treatment options.  She has been increasing her water intake, especially while traveling, which she believes helps alleviate some symptoms. Despite this, she continues to experience discomfort and burning. No recent sexual activity and notes that her symptoms are unusual for her.    Past Medical History:  Diagnosis Date   Actinic keratitis    hx of   Anxiety    Anxiety and depression    Atrophic vaginitis    Back pain    Colon polyp    Hyperplastic polyps 10/2004, Normal Cscope 2010    Constipation    Depression    ENDOMETRIOSIS    Genital HSV    HYPERLIPIDEMIA     Insomnia    Ovarian cyst    bilateral, s/p drainage    Past Surgical History:  Procedure Laterality Date   ANKLE FRACTURE SURGERY     APPENDECTOMY     BREAST IMPLANT EXCHANGE     breast lift-implant 2009, repeated 08-2009     COLONOSCOPY  2010,2015   EYE SURGERY     PRK for vision correction 02-2012   POLYPECTOMY  2015   adenoma polyp   TONSILLECTOMY AND ADENOIDECTOMY  1966   TUBAL LIGATION     uterine ablation      Family History  Problem Relation Age of Onset   Diabetes Mother    Cancer - Lung Mother        smoker   Skin cancer Mother    COPD Mother    Depression Mother    Alcohol abuse Mother    Alcohol abuse Father    Cancer - Lung Father        smoker   Heart disease Maternal Grandmother    Heart disease Maternal Grandfather    Heart disease Paternal Grandmother    Heart disease Paternal Grandfather    Colon cancer Paternal Grandfather 63   Breast cancer Neg Hx    Colon polyps Neg Hx    Esophageal cancer Neg Hx    Rectal cancer Neg Hx    Stomach cancer Neg Hx     Social History  Socioeconomic History   Marital status: Married    Spouse name: Not on file   Number of children: 2   Years of education: Not on file   Highest education level: Master's degree (e.g., MA, MS, MEng, MEd, MSW, MBA)  Occupational History   Occupation: pay roll-tax analyst  Tobacco Use   Smoking status: Former    Current packs/day: 0.00    Average packs/day: 0.5 packs/day for 16.0 years (8.0 ttl pk-yrs)    Types: Cigarettes    Start date: 32    Quit date: 34    Years since quitting: 29.8   Smokeless tobacco: Never   Tobacco comments:    quit 1997  Vaping Use   Vaping status: Never Used  Substance and Sexual Activity   Alcohol use: Yes    Alcohol/week: 1.0 standard drink of alcohol    Types: 1 Standard drinks or equivalent per week    Comment: socially    Drug use: No   Sexual activity: Not on file  Other Topics Concern   Not on file  Social History Narrative    G3 P2 ab 1     Married to [place] , 1 child alive , lost one            Social Drivers of Corporate Investment Banker Strain: Low Risk  (05/07/2023)   Overall Financial Resource Strain (CARDIA)    Difficulty of Paying Living Expenses: Not hard at all  Food Insecurity: No Food Insecurity (05/07/2023)   Hunger Vital Sign    Worried About Running Out of Food in the Last Year: Never true    Ran Out of Food in the Last Year: Never true  Transportation Needs: No Transportation Needs (05/07/2023)   PRAPARE - Administrator, Civil Service (Medical): No    Lack of Transportation (Non-Medical): No  Physical Activity: Sufficiently Active (05/07/2023)   Exercise Vital Sign    Days of Exercise per Week: 5 days    Minutes of Exercise per Session: 50 min  Stress: No Stress Concern Present (05/07/2023)   [employer] of Occupational Health - Occupational Stress Questionnaire    Feeling of Stress : Only a little  Social Connections: Unknown (12/08/2023)   Social Connection and Isolation Panel    Frequency of Communication with Friends and Family: Twice a week    Frequency of Social Gatherings with Friends and Family: Not on file    Attends Religious Services: Not on Marketing Executive or Organizations: No    Attends Banker Meetings: Not on file    Marital Status: Not on file  Intimate Partner Violence: Not on file    Outpatient Medications Prior to Visit  Medication Sig Dispense Refill   atorvastatin  (LIPITOR) 20 MG tablet Take 1 tablet (20 mg total) by mouth daily. 90 tablet 1   Cholecalciferol  (VITAMIN D3) 1.25 MG (50000 UT) CAPS Take 1 capsule (50,000 Units total) by mouth once a week. 12 capsule 0   LORazepam  (ATIVAN ) 0.5 MG tablet TAKE 1 TABLET BY MOUTH AT BEDTIME AS NEEDED FOR ANXIETY 30 tablet 3   Multiple Vitamins-Minerals (MULTIVITAMIN,TX-MINERALS) tablet Take 1 tablet by mouth daily.       sertraline  (ZOLOFT ) 50 MG tablet TAKE 1 TABLET BY MOUTH  EVERY DAY 90 tablet 1   tirzepatide  (ZEPBOUND ) 2.5 MG/0.5ML injection vial Inject 2.5 mg into the skin once a week. 2 mL 0   No facility-administered medications prior to  visit.    Allergies  Allergen Reactions   Codeine Phosphate     REACTION: unspecified   Penicillins     REACTION: unspecified   Sulfonamide Derivatives     REACTION: unspecified    Review of Systems  Constitutional:  Negative for fever and malaise/fatigue.  HENT:  Negative for congestion.   Eyes:  Negative for blurred vision.  Respiratory:  Negative for cough and shortness of breath.   Cardiovascular:  Negative for chest pain, palpitations and leg swelling.  Gastrointestinal:  Positive for abdominal pain. Negative for vomiting.  Genitourinary:  Positive for dysuria and flank pain.  Musculoskeletal:  Negative for back pain.  Skin:  Negative for rash.  Neurological:  Negative for loss of consciousness and headaches.       Objective:    Physical Exam Vitals and nursing note reviewed.  Abdominal:     Tenderness: There is abdominal tenderness in the suprapubic area. There is right CVA tenderness and left CVA tenderness. There is no guarding or rebound.     BP 98/64 (BP Location: Left Arm, Patient Position: Sitting, Cuff Size: Normal)   Pulse 68   Temp 98.1 F (36.7 C) (Oral)   Resp 16   Ht 5' 5 (1.651 m)   Wt 161 lb 6.4 oz (73.2 kg)   LMP  (LMP Unknown)   SpO2 98%   BMI 26.86 kg/m  Wt Readings from Last 3 Encounters:  10/20/24 161 lb 6.4 oz (73.2 kg)  07/26/24 154 lb (69.9 kg)  04/25/24 152 lb (68.9 kg)    Diabetic Foot Exam - Simple   No data filed    Lab Results  Component Value Date   WBC 6.1 12/28/2023   HGB 12.7 12/28/2023   HCT 38.1 12/28/2023   PLT 292.0 12/28/2023   GLUCOSE 85 11/25/2023   CHOL 218 (H) 11/25/2023   TRIG 81 11/25/2023   HDL 68 11/25/2023   LDLDIRECT 164.0 07/16/2011   LDLCALC 136 (H) 11/25/2023   ALT 7 11/25/2023   AST 18 11/25/2023   NA 139 11/25/2023    K 5.3 (H) 11/25/2023   CL 101 11/25/2023   CREATININE 0.90 11/25/2023   BUN 14 11/25/2023   CO2 23 11/25/2023   TSH 1.960 03/05/2023   HGBA1C 5.8 (H) 11/25/2023    Lab Results  Component Value Date   TSH 1.960 03/05/2023   Lab Results  Component Value Date   WBC 6.1 12/28/2023   HGB 12.7 12/28/2023   HCT 38.1 12/28/2023   MCV 93.4 12/28/2023   PLT 292.0 12/28/2023   Lab Results  Component Value Date   NA 139 11/25/2023   K 5.3 (H) 11/25/2023   CO2 23 11/25/2023   GLUCOSE 85 11/25/2023   BUN 14 11/25/2023   CREATININE 0.90 11/25/2023   BILITOT 0.4 11/25/2023   ALKPHOS 75 11/25/2023   AST 18 11/25/2023   ALT 7 11/25/2023   PROT 6.9 11/25/2023   ALBUMIN 4.5 11/25/2023   CALCIUM  10.1 11/25/2023   ANIONGAP 7 03/08/2019   EGFR 73 11/25/2023   GFR 82.36 01/07/2022   Lab Results  Component Value Date   CHOL 218 (H) 11/25/2023   Lab Results  Component Value Date   HDL 68 11/25/2023   Lab Results  Component Value Date   LDLCALC 136 (H) 11/25/2023   Lab Results  Component Value Date   TRIG 81 11/25/2023   Lab Results  Component Value Date   CHOLHDL 4 01/16/2023   Lab Results  Component Value Date   HGBA1C 5.8 (H) 11/25/2023       Assessment & Plan:  Dysuria -     POCT Urinalysis Dipstick (Automated) -     Urine Culture -     Nitrofurantoin Monohyd Macro; Take 1 capsule (100 mg total) by mouth 2 (two) times daily.  Dispense: 14 capsule; Refill: 0 -     Phenazopyridine HCl; Take 1 tablet (200 mg total) by mouth 3 (three) times daily as needed for up to 6 days for pain.  Dispense: 6 tablet; Refill: 0  Assessment and Plan Assessment & Plan Urinary tract infection with dysuria   She presents with dysuria, lower abdominal pain, and lower back pain for one week. Urinalysis shows bacteria without nitrates, indicating a possible UTI. Differential diagnosis includes muscle strain from recent physical activity. Culture is pending for confirmation. Symptoms have  slightly improved today. There is no recent sexual activity. She is allergic to penicillin and sulfa drugs. Prescribe Macrobid for UTI treatment and Pyridium for dysuria. Advise monitoring symptoms and call if not improved by Monday. Prescriptions sent to pharmacy.    Cacie Gaskins R Lowne Chase, DO

## 2024-10-21 LAB — URINE CULTURE
MICRO NUMBER:: 17231058
Result:: NO GROWTH
SPECIMEN QUALITY:: ADEQUATE

## 2024-10-23 ENCOUNTER — Ambulatory Visit: Payer: Self-pay | Admitting: Family Medicine

## 2024-10-25 ENCOUNTER — Other Ambulatory Visit: Payer: Self-pay | Admitting: Bariatrics

## 2024-10-26 ENCOUNTER — Ambulatory Visit: Admitting: Bariatrics

## 2024-11-12 ENCOUNTER — Encounter: Payer: Self-pay | Admitting: Bariatrics

## 2024-11-14 ENCOUNTER — Other Ambulatory Visit (INDEPENDENT_AMBULATORY_CARE_PROVIDER_SITE_OTHER): Payer: Self-pay | Admitting: Bariatrics

## 2024-11-14 MED ORDER — TIRZEPATIDE-WEIGHT MANAGEMENT 2.5 MG/0.5ML ~~LOC~~ SOLN: 2.5000 mg | SUBCUTANEOUS | 0 refills | Status: DC

## 2024-11-24 ENCOUNTER — Telehealth: Payer: Self-pay

## 2024-11-24 NOTE — Telephone Encounter (Signed)
 Zepbound  denied due to Wegovy  being a preferred drug.

## 2024-11-24 NOTE — Telephone Encounter (Signed)
 Started PA for Zepbound via covermymeds

## 2024-12-02 ENCOUNTER — Other Ambulatory Visit: Payer: Self-pay | Admitting: Internal Medicine

## 2024-12-09 ENCOUNTER — Other Ambulatory Visit (INDEPENDENT_AMBULATORY_CARE_PROVIDER_SITE_OTHER): Payer: Self-pay | Admitting: Bariatrics

## 2024-12-15 ENCOUNTER — Ambulatory Visit (INDEPENDENT_AMBULATORY_CARE_PROVIDER_SITE_OTHER): Admitting: Bariatrics

## 2024-12-15 ENCOUNTER — Encounter: Payer: Self-pay | Admitting: Bariatrics

## 2024-12-15 VITALS — BP 108/70 | HR 64 | Ht 65.0 in | Wt 157.0 lb

## 2024-12-15 DIAGNOSIS — E669 Obesity, unspecified: Secondary | ICD-10-CM | POA: Diagnosis not present

## 2024-12-15 DIAGNOSIS — Z6826 Body mass index (BMI) 26.0-26.9, adult: Secondary | ICD-10-CM

## 2024-12-15 DIAGNOSIS — R632 Polyphagia: Secondary | ICD-10-CM

## 2024-12-15 NOTE — Progress Notes (Signed)
 "                                                                                                             WEIGHT SUMMARY AND BIOMETRICS  Weight Lost Since Last Visit: 0lb  Weight Gained Since Last Visit: 3lb   Vitals BP: 108/70 Pulse Rate: 64 SpO2: 100 %   Anthropometric Measurements Height: 5' 5 (1.651 m) Weight: 157 lb (71.2 kg) BMI (Calculated): 26.13 Weight at Last Visit: 154lb Weight Lost Since Last Visit: 0lb Weight Gained Since Last Visit: 3lb Starting Weight: 187lb Total Weight Loss (lbs): 30 lb (13.6 kg)   Body Composition  Body Fat %: 32.6 % Fat Mass (lbs): 51.2 lbs Muscle Mass (lbs): 100.6 lbs Total Body Water (lbs): 69.2 lbs Visceral Fat Rating : 8   Other Clinical Data Fasting: no Labs: no Today's Visit #: 12 Starting Date: 03/05/23    OBESITY Sura is here to discuss her progress with her obesity treatment plan along with follow-up of her obesity related diagnoses.    Nutrition Plan: the Category 1 plan - 50% adherence.  Current exercise: none  Interim History:  She is up 3 pounds since her last visit.  She states that her appetite and cravings are fairly consistent.  The bioimpedance machine showed that her muscle was up 0.8 pounds, her water stable, and her body fat percentage is up 0.6 pounds.  Protein intake is as prescribed, Is not skipping meals, Not journaling consistently., and Water intake is adequate.   Pharmacotherapy: Chelsee is on Zepbound  2.5 mg SQ weekly Adverse side effects: None Hunger is moderately controlled.  Cravings are moderately controlled.  Assessment/Plan:   Kailena Lubas endorses excessive hunger.  Medication(s): Zepbound  2.5 mg Effects of medication (appetite):  moderately controlled. Cravings are moderately controlled.   Plan: Medication(s): Zepbound  2.5 mg SQ weekly Will increase water, protein and fiber to help assuage hunger.  Will minimize foods that have a high glucose index/load to minimize  reactive hypoglycemia.    Generalized Obesity: Current BMI BMI (Calculated): 26.13   Pharmacotherapy Plan Continue  Zepbound  2.5 mg SQ weekly.  She states that she may want to increase her Zepbound  back up to 5.0 mg if her appetite continues to increase. At this point we will leave the Zepbound  at the 2.5 mg weekly.  Blakelynn is currently in the action stage of change. As such, her goal is to maintain weight for now.  She has agreed to the Category 1 plan.  Exercise goals: All adults should avoid inactivity. Some physical activity is better than none, and adults who participate in any amount of physical activity gain some health benefits.  Behavioral modification strategies: increasing lean protein intake, decreasing simple carbohydrates , no meal skipping, decrease eating out, meal planning , increase water intake, better snacking choices, planning for success, increasing vegetables, avoiding temptations, keep healthy foods in the home, weigh protein portions, measure portion sizes, work on smaller portions, and mindful eating.  Joyia has agreed to follow-up with our clinic in 3 months.  Objective:   VITALS: Per patient if applicable, see vitals. GENERAL: Alert and in no acute distress. CARDIOPULMONARY: No increased WOB. Speaking in clear sentences.  PSYCH: Pleasant and cooperative. Speech normal rate and rhythm. Affect is appropriate. Insight and judgement are appropriate. Attention is focused, linear, and appropriate.  NEURO: Oriented as arrived to appointment on time with no prompting.   Attestation Statements:   This was prepared with the assistance of Engineer, Civil (consulting).  Occasional wrong-word or sound-a-like substitutions may have occurred due to the inherent limitations of voice recognition.   Clayborne Daring, DO   "

## 2024-12-21 ENCOUNTER — Other Ambulatory Visit: Payer: Self-pay | Admitting: Medical Genetics

## 2024-12-26 ENCOUNTER — Telehealth: Payer: Self-pay | Admitting: Internal Medicine

## 2024-12-26 NOTE — Telephone Encounter (Signed)
 Requesting: lorazepam  0.5mg   Contract:01/27/23 UDS:12/28/23 Last Visit: 12/28/23 Next Visit: 12/28/24 Last Refill: 09/01/24 #30 and 3RF   Please Advise

## 2024-12-26 NOTE — Telephone Encounter (Signed)
 PDMP okay, Rx sent

## 2024-12-27 ENCOUNTER — Encounter: Payer: Self-pay | Admitting: Internal Medicine

## 2024-12-28 ENCOUNTER — Encounter: Payer: Self-pay | Admitting: Internal Medicine

## 2024-12-28 ENCOUNTER — Ambulatory Visit: Payer: Commercial Managed Care - PPO | Admitting: Internal Medicine

## 2024-12-28 VITALS — BP 106/72 | HR 72 | Temp 98.1°F | Resp 16 | Ht 65.0 in | Wt 160.0 lb

## 2024-12-28 DIAGNOSIS — E785 Hyperlipidemia, unspecified: Secondary | ICD-10-CM | POA: Diagnosis not present

## 2024-12-28 DIAGNOSIS — Z0000 Encounter for general adult medical examination without abnormal findings: Secondary | ICD-10-CM

## 2024-12-28 DIAGNOSIS — F32A Depression, unspecified: Secondary | ICD-10-CM | POA: Diagnosis not present

## 2024-12-28 DIAGNOSIS — F419 Anxiety disorder, unspecified: Secondary | ICD-10-CM | POA: Diagnosis not present

## 2024-12-28 DIAGNOSIS — R739 Hyperglycemia, unspecified: Secondary | ICD-10-CM

## 2024-12-28 DIAGNOSIS — Z79899 Other long term (current) drug therapy: Secondary | ICD-10-CM

## 2024-12-28 LAB — CBC WITH DIFFERENTIAL/PLATELET
Basophils Absolute: 0 K/uL (ref 0.0–0.1)
Basophils Relative: 0.9 % (ref 0.0–3.0)
Eosinophils Absolute: 0 K/uL (ref 0.0–0.7)
Eosinophils Relative: 0.7 % (ref 0.0–5.0)
HCT: 38.5 % (ref 36.0–46.0)
Hemoglobin: 13.1 g/dL (ref 12.0–15.0)
Lymphocytes Relative: 30.6 % (ref 12.0–46.0)
Lymphs Abs: 1.5 K/uL (ref 0.7–4.0)
MCHC: 34 g/dL (ref 30.0–36.0)
MCV: 91.5 fl (ref 78.0–100.0)
Monocytes Absolute: 0.3 K/uL (ref 0.1–1.0)
Monocytes Relative: 5.7 % (ref 3.0–12.0)
Neutro Abs: 3.1 K/uL (ref 1.4–7.7)
Neutrophils Relative %: 62.1 % (ref 43.0–77.0)
Platelets: 261 K/uL (ref 150.0–400.0)
RBC: 4.2 Mil/uL (ref 3.87–5.11)
RDW: 12.9 % (ref 11.5–15.5)
WBC: 5 K/uL (ref 4.0–10.5)

## 2024-12-28 LAB — LIPID PANEL
Cholesterol: 196 mg/dL (ref 28–200)
HDL: 69 mg/dL
LDL Cholesterol: 111 mg/dL — ABNORMAL HIGH (ref 10–99)
NonHDL: 127.06
Total CHOL/HDL Ratio: 3
Triglycerides: 81 mg/dL (ref 10.0–149.0)
VLDL: 16.2 mg/dL (ref 0.0–40.0)

## 2024-12-28 LAB — COMPREHENSIVE METABOLIC PANEL WITH GFR
ALT: 6 U/L (ref 3–35)
AST: 17 U/L (ref 5–37)
Albumin: 4.6 g/dL (ref 3.5–5.2)
Alkaline Phosphatase: 64 U/L (ref 39–117)
BUN: 17 mg/dL (ref 6–23)
CO2: 30 meq/L (ref 19–32)
Calcium: 9.7 mg/dL (ref 8.4–10.5)
Chloride: 99 meq/L (ref 96–112)
Creatinine, Ser: 0.85 mg/dL (ref 0.40–1.20)
GFR: 73.88 mL/min
Glucose, Bld: 79 mg/dL (ref 70–99)
Potassium: 4.3 meq/L (ref 3.5–5.1)
Sodium: 134 meq/L — ABNORMAL LOW (ref 135–145)
Total Bilirubin: 0.5 mg/dL (ref 0.2–1.2)
Total Protein: 7.1 g/dL (ref 6.0–8.3)

## 2024-12-28 LAB — HEMOGLOBIN A1C: Hgb A1c MFr Bld: 5.9 % (ref 4.6–6.5)

## 2024-12-28 NOTE — Patient Instructions (Signed)
 Please read your instructions carefully.   GO TO THE LAB :  Get the blood work    Go to the front desk for the checkout Please make an appointment for a physical exam in 1 year   Vaccines I recommend: Flu shot COVID-vaccine

## 2024-12-28 NOTE — Progress Notes (Unsigned)
 "  Subjective:    Patient ID: Adrienne Burke, female    DOB: 1961-04-15, 64 y.o.   MRN: [id number]  DOS:  12/28/2024 CPX  Discussed the use of AI scribe software for clinical note transcription with the patient, who gave verbal consent to proceed.  History of Present Illness Adrienne Burke is a 64 year old female who presents for an annual physical exam.  Has random occasional nausea without vomiting, constipation, diarrhea or change in the color of the stools.  Has occasional headaches but they are not associated with nausea.  Anxiety and depression well controlled on sertraline  and nightly Ativan   Weight management - Uses Zepbound  injections every other week for weight management - Finds Zepbound  helpful but not optimal  Bone health - Followed by Dr. Evangeline for osteopenia - Increasing calcium  intake - Recent vitamin D  level adequate  Wt Readings from Last 3 Encounters:  12/28/24 160 lb (72.6 kg)  12/15/24 157 lb (71.2 kg)  10/20/24 161 lb 6.4 oz (73.2 kg)    Review of Systems  Other than above, a 14 point review of systems is negative    Past Medical History:  Diagnosis Date   Actinic keratitis    hx of   Anxiety    Anxiety and depression    Atrophic vaginitis    Back pain    Colon polyp    Hyperplastic polyps 10/2004, Normal Cscope 2010    Constipation    Depression    ENDOMETRIOSIS    Genital HSV    HYPERLIPIDEMIA    Insomnia    Ovarian cyst    bilateral, s/p drainage    Past Surgical History:  Procedure Laterality Date   ANKLE FRACTURE SURGERY     APPENDECTOMY     BREAST IMPLANT EXCHANGE     breast lift-implant 2009, repeated 08-2009     COLONOSCOPY  2010,2015   EYE SURGERY     PRK for vision correction 02-2012   POLYPECTOMY  2015   adenoma polyp   TONSILLECTOMY AND ADENOIDECTOMY  1966   TUBAL LIGATION     uterine ablation      Current Outpatient Medications  Medication Instructions   atorvastatin  (LIPITOR) 20 mg, Oral, Daily   D3-50  50,000 Units, Oral, Weekly   LORazepam  (ATIVAN ) 0.5 MG tablet TAKE 1 TABLET BY MOUTH AT BEDTIME AS NEEDED FOR ANXIETY   Multiple Vitamins-Minerals (MULTIVITAMIN,TX-MINERALS) tablet 1 tablet, Daily   sertraline  (ZOLOFT ) 50 mg, Oral, Daily   tirzepatide  (ZEPBOUND ) 2.5 mg, Subcutaneous, Weekly       Objective:   Physical Exam BP 106/72   Pulse 72   Temp 98.1 F (36.7 C) (Oral)   Resp 16   Ht 5' 5 (1.651 m)   Wt 160 lb (72.6 kg)   LMP  (LMP Unknown)   SpO2 98%   BMI 26.63 kg/m  General: Well developed, NAD, BMI noted Neck: No  thyromegaly  HEENT:  Normocephalic . Face symmetric, atraumatic Lungs:  CTA B Normal respiratory effort, no intercostal retractions, no accessory muscle use. Heart: RRR,  no murmur.  Abdomen:  Not distended, soft, non-tender. No rebound or rigidity.   Lower extremities: no pretibial edema bilaterally  Skin: Exposed areas without rash. Not pale. Not jaundice Neurologic:  alert & oriented X3.  Speech normal, gait appropriate for age and unassisted Strength symmetric and appropriate for age.  Psych: Cognition and judgment appear intact.  Cooperative with normal attention span and concentration.  Behavior appropriate. No anxious or  depressed appearing.     Assessment     Problem list Hyperglycemia A1c 6.0 01-2018 Hyperlipidemia Anxiety, depression  Osteopenia, Dx 2024 gynecology (per patient) H/oh endometriosis. s/p exploration , DUB, endometrial ablasion H/o actinic keratosis, sees derm q year as of 05/2018 Pulmonary nodules: Last CT 10-2020, stable.  No further scheduled CT Assessment & Plan Here for CPX -Td 2025  - s/p shingrix - Benefits of COVID and flu shot discussed.  Declined -Female care: [facility] 03/2024([facility]) PAP 01/15/2022 ([facility]) DEXA  2019 ([facility]), had a DEXA 2024 at gyn, ostepenia  --Cscope 11-05 + polyps, C-scope 09/2009, normal.  C-scope 11/2019, next 2027 per GI letter  -- Labs: CMP FLP CBC A1c  Other issues Anxiety  depression Well-controlled with sertraline  and lorazepam  at nighttime.  Check UDS Hyperlipidemia Managed with atorvastatin .  Checking FLP Osteopenia and vitamin D  deficiency: Per GYN, reports recent vitamin D  levels okay Hyperglycemia: On Zepbound  for obesity prescribed elsewhere, check A1c. Obesity: LOV healthy weight and wellness clinic 12/15/2024.  On Zepbound  RTC 1 year CPX    "

## 2024-12-29 ENCOUNTER — Ambulatory Visit: Payer: Self-pay | Admitting: Internal Medicine

## 2024-12-29 ENCOUNTER — Encounter: Payer: Self-pay | Admitting: Internal Medicine

## 2024-12-29 NOTE — Assessment & Plan Note (Signed)
 Here for CPX Other issues Anxiety depression Well-controlled with sertraline  and lorazepam  at nighttime.  Check UDS Hyperlipidemia Managed with atorvastatin .  Checking FLP Osteopenia and vitamin D  deficiency: Per GYN, reports recent vitamin D  levels okay Hyperglycemia: On Zepbound  for obesity prescribed elsewhere, check A1c. Obesity: LOV healthy weight and wellness clinic 12/15/2024.  On Zepbound  RTC 1 year CPX

## 2024-12-29 NOTE — Assessment & Plan Note (Signed)
 Here for CPX -Td 2025  - s/p shingrix - Benefits of COVID and flu shot discussed.  Declined -Female care: [facility] 03/2024([facility]) PAP 01/15/2022 ([facility]) DEXA  2019 ([facility]), had a DEXA 2024 at gyn, ostepenia  --Cscope 11-05 + polyps, C-scope 09/2009, normal.  C-scope 11/2019, next 2027 per GI letter  -- Labs: CMP FLP CBC A1c

## 2024-12-31 LAB — DM TEMPLATE

## 2024-12-31 LAB — DRUG MONITORING, PANEL 8 WITH CONFIRMATION, URINE
6 Acetylmorphine: NEGATIVE ng/mL
Alcohol Metabolites: NEGATIVE ng/mL
Alphahydroxyalprazolam: NEGATIVE ng/mL
Alphahydroxymidazolam: NEGATIVE ng/mL
Alphahydroxytriazolam: NEGATIVE ng/mL
Aminoclonazepam: NEGATIVE ng/mL
Amphetamines: NEGATIVE ng/mL
Benzodiazepines: POSITIVE ng/mL — AB
Buprenorphine, Urine: NEGATIVE ng/mL
Cocaine Metabolite: NEGATIVE ng/mL
Creatinine: 103.8 mg/dL
Hydroxyethylflurazepam: NEGATIVE ng/mL
Lorazepam: 350 ng/mL — ABNORMAL HIGH
MDMA: NEGATIVE ng/mL
Marijuana Metabolite: NEGATIVE ng/mL
Nordiazepam: NEGATIVE ng/mL
Opiates: NEGATIVE ng/mL
Oxazepam: NEGATIVE ng/mL
Oxidant: NEGATIVE ug/mL
Oxycodone: NEGATIVE ng/mL
Temazepam: NEGATIVE ng/mL
pH: 7.3 (ref 4.5–9.0)

## 2024-12-31 LAB — PRESCRIBED DRUGS,MEDMATCH(R)

## 2025-01-05 ENCOUNTER — Encounter: Payer: Self-pay | Admitting: Bariatrics

## 2025-01-05 ENCOUNTER — Other Ambulatory Visit: Payer: Self-pay

## 2025-01-05 DIAGNOSIS — E66811 Obesity, class 1: Secondary | ICD-10-CM

## 2025-01-05 MED ORDER — ZEPBOUND 5 MG/0.5ML ~~LOC~~ SOLN: 5.0000 mg | SUBCUTANEOUS | 0 refills | Status: AC

## 2025-01-16 ENCOUNTER — Other Ambulatory Visit: Payer: Self-pay

## 2025-03-09 ENCOUNTER — Ambulatory Visit: Admitting: Bariatrics

## 2026-01-05 ENCOUNTER — Encounter: Admitting: Internal Medicine
